# Patient Record
Sex: Male | Born: 1990 | Race: Black or African American | Hispanic: No | Marital: Single | Smoking: Never smoker
Health system: Southern US, Community
[De-identification: ages and names within clinical notes are randomized; demographics above are authoritative.]

## PROBLEM LIST (undated history)

## (undated) DIAGNOSIS — I10 Essential (primary) hypertension: Secondary | ICD-10-CM

## (undated) DIAGNOSIS — R569 Unspecified convulsions: Secondary | ICD-10-CM

---
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
## 2011-02-13 ENCOUNTER — Emergency Department (HOSPITAL_COMMUNITY)
Admission: EM | Admit: 2011-02-13 | Discharge: 2011-02-13 | Disposition: A | Discharge status: Home / Self Care | Payer: BC Managed Care – PPO | Attending: Emergency Medicine | Admitting: Emergency Medicine

## 2011-02-13 DIAGNOSIS — R112 Nausea with vomiting, unspecified: Secondary | ICD-10-CM | POA: Insufficient documentation

## 2011-02-13 DIAGNOSIS — R197 Diarrhea, unspecified: Secondary | ICD-10-CM | POA: Insufficient documentation

## 2011-02-13 DIAGNOSIS — IMO0001 Reserved for inherently not codable concepts without codable children: Secondary | ICD-10-CM | POA: Insufficient documentation

## 2013-04-22 ENCOUNTER — Ambulatory Visit: Payer: BC Managed Care – PPO | Admitting: Internal Medicine

## 2013-04-22 DIAGNOSIS — Z0289 Encounter for other administrative examinations: Secondary | ICD-10-CM

## 2013-11-16 ENCOUNTER — Ambulatory Visit (INDEPENDENT_AMBULATORY_CARE_PROVIDER_SITE_OTHER): Payer: BC Managed Care – PPO | Admitting: Family

## 2013-11-16 ENCOUNTER — Encounter: Payer: Self-pay | Admitting: Family

## 2013-11-16 VITALS — BP 100/80 | HR 59 | Ht 73.5 in | Wt 162.0 lb

## 2013-11-16 DIAGNOSIS — Z0000 Encounter for general adult medical examination without abnormal findings: Secondary | ICD-10-CM

## 2013-11-16 DIAGNOSIS — Z23 Encounter for immunization: Secondary | ICD-10-CM

## 2013-11-16 NOTE — Progress Notes (Signed)
  Subjective:    Patient ID: Tim Foley, male    DOB: 1991/03/20, 22 y.o.   MRN: [id number]  HPI 22 year old [place] American male, nonsmoker, and sent for complete physical exam and to establish care. He denies any concerns today. He is declining any fasting labs for STD screening. Declines flu shot.   Review of Systems  Constitutional: Negative.   HENT: Negative.   Eyes: Negative.   Respiratory: Negative.   Cardiovascular: Negative.   Gastrointestinal: Negative.   Endocrine: Negative.   Genitourinary: Negative.   Musculoskeletal: Negative.   Skin: Negative.   Allergic/Immunologic: Negative.   Neurological: Negative.   Hematological: Negative.   Psychiatric/Behavioral: Negative.    History reviewed. No pertinent past medical history.  History   Social History  . Marital Status: Single    Spouse Name: N/A    Number of Children: N/A  . Years of Education: N/A   Occupational History  . Not on file.   Social History Main Topics  . Smoking status: Never Smoker   . Smokeless tobacco: Not on file  . Alcohol Use: No  . Drug Use: No  . Sexual Activity: Not on file   Other Topics Concern  . Not on file   Social History Narrative  . No narrative on file    History reviewed. No pertinent past surgical history.  No family history on file.  No Known Allergies  No current outpatient prescriptions on file prior to visit.   No current facility-administered medications on file prior to visit.    BP 100/80  Pulse 59  Ht 6' 1.5" (1.867 m)  Wt 162 lb (73.483 kg)  BMI 21.08 kg/m2chart     Objective:   Physical Exam  Constitutional: He is oriented to person, place, and time. He appears well-developed and well-nourished.  HENT:  Right Ear: External ear normal.  Left Ear: External ear normal.  Nose: Nose normal.  Mouth/Throat: Oropharynx is clear and moist.  Eyes: Conjunctivae and EOM are normal. Pupils are equal, round, and reactive to light.  Neck: Normal  range of motion. Neck supple.  Cardiovascular: Normal rate, regular rhythm and normal heart sounds.   Pulmonary/Chest: Effort normal and breath sounds normal.  Abdominal: Soft. Bowel sounds are normal.  Genitourinary: Penis normal. No penile tenderness.  Musculoskeletal: Normal range of motion.  Neurological: He is alert and oriented to person, place, and time. He has normal reflexes.  Skin: Skin is warm and dry.  Psychiatric: He has a normal mood and affect.          Assessment & Plan:  Assessment: Complete physical exam   Plan: Encouraged healthy diet, exercise, monthly testicular exams, exercise, safe sex practices, refrain from tobacco and alcohol use.Recheck in the near and sooner as needed

## 2013-11-16 NOTE — Patient Instructions (Signed)
Exercise to Stay Healthy Exercise helps you become and stay healthy. EXERCISE IDEAS AND TIPS Choose exercises that:  You enjoy.  Fit into your day. You do not need to exercise really hard to be healthy. You can do exercises at a slow or medium level and stay healthy. You can:  Stretch before and after working out.  Try yoga, Pilates, or tai chi.  Lift weights.  Walk fast, swim, jog, run, climb stairs, bicycle, dance, or rollerskate.  Take aerobic classes. Exercises that burn about 150 calories:  Running 1  miles in 15 minutes.  Playing volleyball for 45 to 60 minutes.  Washing and waxing a car for 45 to 60 minutes.  Playing touch football for 45 minutes.  Walking 1  miles in 35 minutes.  Pushing a stroller 1  miles in 30 minutes.  Playing basketball for 30 minutes.  Raking leaves for 30 minutes.  Bicycling 5 miles in 30 minutes.  Walking 2 miles in 30 minutes.  Dancing for 30 minutes.  Shoveling snow for 15 minutes.  Swimming laps for 20 minutes.  Walking up stairs for 15 minutes.  Bicycling 4 miles in 15 minutes.  Gardening for 30 to 45 minutes.  Jumping rope for 15 minutes.  Washing windows or floors for 45 to 60 minutes. Document Released: 01/18/2011 Document Revised: 03/09/2012 Document Reviewed: 01/18/2011 Exit[employer] Patient Information 2014 [employer].  

## 2017-07-23 ENCOUNTER — Ambulatory Visit (INDEPENDENT_AMBULATORY_CARE_PROVIDER_SITE_OTHER): Payer: BLUE CROSS/BLUE SHIELD | Admitting: Adult Health

## 2017-07-23 ENCOUNTER — Encounter: Payer: Self-pay | Admitting: Adult Health

## 2017-07-23 VITALS — BP 110/76 | HR 79 | Temp 98.1°F | Ht 73.5 in | Wt 167.3 lb

## 2017-07-23 DIAGNOSIS — Z0000 Encounter for general adult medical examination without abnormal findings: Secondary | ICD-10-CM

## 2017-07-23 NOTE — Patient Instructions (Signed)
It was great meeting you today   Continue to stay active and exercise. Work on a heart healthy diet.   You can follow up with me in 1-2 years for your next physical unless you need something before that

## 2017-07-23 NOTE — Progress Notes (Signed)
Patient presents to clinic today to establish care. He is a pleasant 26 year old male who  has no past medical history on file.   Acute Concerns: CPE    Chronic Issues: None   Health Maintenance: Dental -- Does not do routine care  Vision -- Does not do routine care Immunizations -- UTD Diet:  Does not follow a specific diet.  Exercise: He plays a lot of basketball   No past medical history on file.  No past surgical history on file.  No current outpatient prescriptions on file prior to visit.   No current facility-administered medications on file prior to visit.     No Known Allergies  Family History  Problem Relation Age of Onset  . High blood pressure Mother   . Colon cancer Maternal Grandmother   . High blood pressure Maternal Grandmother   . Diabetes Maternal Grandmother     Social History   Social History  . Marital status: Single    Spouse name: N/A  . Number of children: N/A  . Years of education: N/A   Occupational History  . Not on file.   Social History Main Topics  . Smoking status: Never Smoker  . Smokeless tobacco: Never Used  . Alcohol use 1.2 oz/week    1 Glasses of wine, 1 Cans of beer per week  . Drug use: No  . Sexual activity: Yes    Partners: Female    Birth control/ protection: None   Other Topics Concern  . Not on file   Social History Narrative   He works at [employer] and gamble       He likes to be outside and play basketball.        Review of Systems  Constitutional: Negative.   HENT: Negative.   Eyes: Negative.   Respiratory: Negative.   Cardiovascular: Negative.   Gastrointestinal: Negative.   Genitourinary: Negative.   Musculoskeletal: Negative.   Skin: Negative.   Neurological: Negative.   Endo/Heme/Allergies: Negative.   Psychiatric/Behavioral: Negative.   All other systems reviewed and are negative.   BP 132/68 (BP Location: Left Arm, Patient Position: Sitting, Cuff Size: Normal)   Pulse 79   Temp  98.1 F (36.7 C) (Oral)   Ht 6' 1.5" (1.867 m)   Wt 167 lb 4.8 oz (75.9 kg)   SpO2 98%   BMI 21.77 kg/m   Physical Exam  Constitutional: He is oriented to person, place, and time and well-developed, well-nourished, and in no distress. No distress.  HENT:  Head: Normocephalic and atraumatic.  Right Ear: External ear normal.  Left Ear: External ear normal.  Nose: Nose normal.  Mouth/Throat: Oropharynx is clear and moist. No oropharyngeal exudate.  Eyes: Pupils are equal, round, and reactive to light. Conjunctivae and EOM are normal. Right eye exhibits no discharge. Left eye exhibits no discharge. No scleral icterus.  Neck: Normal range of motion. Neck supple. No JVD present. No tracheal deviation present. No thyromegaly present.  Cardiovascular: Normal rate, regular rhythm, normal heart sounds and intact distal pulses.  Exam reveals no gallop and no friction rub.   No murmur heard. Pulmonary/Chest: Effort normal and breath sounds normal. No stridor. No respiratory distress. He has no wheezes. He has no rales. He exhibits no tenderness.  Abdominal: Soft. Bowel sounds are normal. He exhibits no distension and no mass. There is no tenderness. There is no rebound and no guarding.  Musculoskeletal: Normal range of motion. He exhibits no edema, tenderness  or deformity.  Lymphadenopathy:    He has no cervical adenopathy.  Neurological: He is alert and oriented to person, place, and time. He displays normal reflexes. No cranial nerve deficit. He exhibits normal muscle tone. Coordination normal. GCS score is 15.  Skin: Skin is warm and dry. No rash noted. He is not diaphoretic. No erythema. No pallor.  Psychiatric: Mood, memory, affect and judgment normal.  Nursing note and vitals reviewed.  Assessment/Plan: 1. Routine general medical examination at a health care facility - Benign exam  - He is a healthy 26 year old AA male  - Will forgo lab work today  - He is not concerned for STI  -  Follow up in 1-2 years or PRN for acute issues   Shirline Freesory Franco Duley, NP

## 2020-07-10 ENCOUNTER — Emergency Department (HOSPITAL_COMMUNITY): Payer: BLUE CROSS/BLUE SHIELD

## 2020-07-10 ENCOUNTER — Other Ambulatory Visit: Payer: Self-pay

## 2020-07-10 ENCOUNTER — Emergency Department (HOSPITAL_COMMUNITY)
Admission: EM | Admit: 2020-07-10 | Discharge: 2020-07-10 | Disposition: A | Discharge status: Home / Self Care | Payer: BLUE CROSS/BLUE SHIELD | Attending: Emergency Medicine | Admitting: Emergency Medicine

## 2020-07-10 ENCOUNTER — Encounter (HOSPITAL_COMMUNITY): Payer: Self-pay | Admitting: Emergency Medicine

## 2020-07-10 DIAGNOSIS — I10 Essential (primary) hypertension: Secondary | ICD-10-CM

## 2020-07-10 DIAGNOSIS — R0602 Shortness of breath: Secondary | ICD-10-CM | POA: Insufficient documentation

## 2020-07-10 DIAGNOSIS — Z20822 Contact with and (suspected) exposure to covid-19: Secondary | ICD-10-CM | POA: Insufficient documentation

## 2020-07-10 LAB — BASIC METABOLIC PANEL
Anion gap: 14 (ref 5–15)
BUN: 10 mg/dL (ref 6–20)
CO2: 26 mmol/L (ref 22–32)
Calcium: 9.4 mg/dL (ref 8.9–10.3)
Chloride: 99 mmol/L (ref 98–111)
Creatinine, Ser: 1.08 mg/dL (ref 0.61–1.24)
GFR calc Af Amer: >60 mL/min (ref >60)
GFR calc non Af Amer: >60 mL/min (ref >60)
Glucose, Bld: 96 mg/dL (ref 70–99)
Potassium: 3.3 mmol/L — ABNORMAL LOW (ref 3.5–5.1)
Sodium: 139 mmol/L (ref 135–145)

## 2020-07-10 LAB — CBC
HCT: 42.8 % (ref 39.0–52.0)
Hemoglobin: 13.6 g/dL (ref 13.0–17.0)
MCH: 23.1 pg — ABNORMAL LOW (ref 26.0–34.0)
MCHC: 31.8 g/dL (ref 30.0–36.0)
MCV: 72.5 fL — ABNORMAL LOW (ref 80.0–100.0)
Platelets: 263 10*3/uL (ref 150–400)
RBC: 5.9 MIL/uL — ABNORMAL HIGH (ref 4.22–5.81)
RDW: 14.3 % (ref 11.5–15.5)
WBC: 7.6 10*3/uL (ref 4.0–10.5)
nRBC: 0 % (ref 0.0–0.2)

## 2020-07-10 LAB — D-DIMER, QUANTITATIVE: D-Dimer, Quant: <0.27 ug/mL-FEU (ref 0.00–0.50)

## 2020-07-10 LAB — CBG MONITORING, ED: Glucose-Capillary: 90 mg/dL (ref 70–99)

## 2020-07-10 LAB — SARS CORONAVIRUS 2 BY RT PCR (HOSPITAL ORDER, PERFORMED IN ~~LOC~~ HOSPITAL LAB): SARS Coronavirus 2: NEGATIVE

## 2020-07-10 LAB — TROPONIN I (HIGH SENSITIVITY): Troponin I (High Sensitivity): 3 ng/L (ref <18)

## 2020-07-10 MED ORDER — CETIRIZINE HCL 10 MG PO TABS: 10.0000 mg | ORAL_TABLET | Freq: Every day | ORAL | 1 refills | Status: DC

## 2020-07-10 MED ORDER — ASPIRIN 81 MG PO CHEW
324.0000 mg | CHEWABLE_TABLET | Freq: Once | ORAL | Status: AC
  Administered 2020-07-10: 324 mg via ORAL
  Filled 2020-07-10: qty 4

## 2020-07-10 MED ORDER — NAPROXEN 500 MG PO TABS: 500.0000 mg | ORAL_TABLET | Freq: Two times a day (BID) | ORAL | 0 refills | Status: DC

## 2020-07-10 MED ORDER — FLUTICASONE PROPIONATE 50 MCG/ACT NA SUSP: 1.0000 | Freq: Every day | NASAL | 2 refills | Status: DC

## 2020-07-10 MED ORDER — ACETAMINOPHEN 325 MG PO TABS
650.0000 mg | ORAL_TABLET | Freq: Once | ORAL | Status: AC
  Administered 2020-07-10: 650 mg via ORAL
  Filled 2020-07-10: qty 2

## 2020-07-10 NOTE — ED Triage Notes (Signed)
Patient presents with SOB for a week, worsening today.

## 2020-07-10 NOTE — Discharge Instructions (Addendum)
Take the medications as prescribed, follow-up with your primary care doctor to recheck your blood pressure and see if the medication helps.  Discuss further evaluation such as an echocardiogram.  Return to the ED as needed for worsening symptoms

## 2020-07-10 NOTE — ED Provider Notes (Signed)
[facility]-EMERGENCY DEPT Provider Note   CSN: [id number] Arrival date & time: 07/10/20  1717     History Chief Complaint  Patient presents with  . Shortness of Breath    Tim Foley is a 29 y.o. male.  HPI   Patient presents the emergency room for evaluation of shortness of breath.  Patient states the symptoms started about a week or so ago.  He feels like they are worsening today.  It is somewhat intermittent and mostly feels like he has difficulty breathing through his nose a lot to breathe through his mouth.  He denies having any chest pain.  He has not had any fevers.  He denies any trouble with his sense of taste or smell.  No known Covid exposure.  Patient does not use any drugs.  He does smoke hookah occasionally.  Patient does not have any history of medical problems.  He has not been vaccinated against COVID-19  History reviewed. No pertinent past medical history.  There are no problems to display for this patient.   History reviewed. No pertinent surgical history.     Family History  Problem Relation Age of Onset  . High blood pressure Mother   . Colon cancer Maternal Grandmother   . High blood pressure Maternal Grandmother   . Diabetes Maternal Grandmother     Social History   Tobacco Use  . Smoking status: Never Smoker  . Smokeless tobacco: Never Used  Substance Use Topics  . Alcohol use: Yes    Alcohol/week: 2.0 standard drinks    Types: 1 Glasses of wine, 1 Cans of beer per week  . Drug use: No    Home Medications Prior to Admission medications   Medication Sig Start Date End Date Taking? Authorizing Provider  cetirizine (ZYRTEC) 10 MG tablet Take 1 tablet (10 mg total) by mouth daily. 07/10/20   Linwood Dibbles, MD  fluticasone (FLONASE) 50 MCG/ACT nasal spray Place 1 spray into both nostrils daily. 07/10/20   Linwood Dibbles, MD    Allergies    Patient has no known allergies.  Review of Systems   Review of Systems  All other  systems reviewed and are negative.   Physical Exam Updated Vital Signs BP (!) 151/76   Pulse 74   Temp 98 F (36.7 C) (Oral)   Resp 18   Ht 1.88 m (6\' 2" )   Wt 90.7 kg   SpO2 100%   BMI 25.68 kg/m   Physical Exam Vitals and nursing note reviewed.  Constitutional:      General: He is not in acute distress.    Appearance: He is well-developed.  HENT:     Head: Normocephalic and atraumatic.     Right Ear: External ear normal.     Left Ear: External ear normal.  Eyes:     General: No scleral icterus.       Right eye: No discharge.        Left eye: No discharge.     Conjunctiva/sclera: Conjunctivae normal.  Neck:     Trachea: No tracheal deviation.  Cardiovascular:     Rate and Rhythm: Normal rate and regular rhythm.  Pulmonary:     Effort: Pulmonary effort is normal. No respiratory distress.     Breath sounds: Normal breath sounds. No stridor. No wheezing or rales.  Abdominal:     General: Bowel sounds are normal. There is no distension.     Palpations: Abdomen is soft.  Tenderness: There is no abdominal tenderness. There is no guarding or rebound.  Musculoskeletal:        General: No tenderness.     Cervical back: Neck supple.  Skin:    General: Skin is warm and dry.     Findings: No rash.  Neurological:     Mental Status: He is alert.     Cranial Nerves: No cranial nerve deficit (no facial droop, extraocular movements intact, no slurred speech).     Sensory: No sensory deficit.     Motor: No abnormal muscle tone or seizure activity.     Coordination: Coordination normal.     ED Results / Procedures / Treatments   Labs (all labs ordered are listed, but only abnormal results are displayed) Labs Reviewed  BASIC METABOLIC PANEL - Abnormal; Notable for the following components:      Result Value   Potassium 3.3 (*)    All other components within normal limits  CBC - Abnormal; Notable for the following components:   RBC 5.90 (*)    MCV 72.5 (*)    MCH  23.1 (*)    All other components within normal limits  SARS CORONAVIRUS 2 BY RT PCR (HOSPITAL ORDER, PERFORMED IN [facility])  D-DIMER, QUANTITATIVE (NOT AT [facility])  CBG MONITORING, ED  TROPONIN I (HIGH SENSITIVITY)    EKG EKG Interpretation  Date/Time:  Monday July 10 2020 17:32:27 EDT Ventricular Rate:  81 PR Interval:    QRS Duration: 82 QT Interval:  359 QTC Calculation: 417 R Axis:   73 Text Interpretation: Ectopic atrial rhythm Consider left atrial enlargement nonspecific st changes inferiorly Borderline ST elevation, anterior leads No previous tracing Confirmed by Linwood Dibbles [phone]) on 07/10/2020 7:13:50 PM   Radiology DG Chest Portable 1 View  Result Date: 07/10/2020 CLINICAL DATA:  Dyspnea EXAM: PORTABLE CHEST 1 VIEW COMPARISON:  None. FINDINGS: The heart size and mediastinal contours are within normal limits. Both lungs are clear. The visualized skeletal structures are unremarkable. IMPRESSION: No active disease. Electronically Signed   By: Jonna Clark M.D.   On: 07/10/2020 18:25    Procedures Procedures (including critical care time)  Medications Ordered in ED Medications  aspirin chewable tablet 324 mg (324 mg Oral Given 07/10/20 1800)    ED Course  I have reviewed the triage vital signs and the nursing notes.  Pertinent labs & imaging results that were available during my care of the patient were reviewed by me and considered in my medical decision making (see chart for details).  Clinical Course as of Jul 11 1999  Mon Jul 10, 2020  1746 EKG abnormal.  Doubt ST elevation MI   [JK]  1925 Labs reviewed.  Covid test is negative.  Heart enzyme is normal.  D-dimer is negative.   [JK]  1925 Chest x-ray does not show pneumonia.   [JK]    Clinical Course User Index [JK] Linwood Dibbles, MD   MDM Rules/Calculators/A&P                          Patient is into the ED for evaluation of shortness of breath.  Patient states he primarily has difficulty  breathing through his nose and will at times have to breathe through his mouth.  In the ED he is breathing comfortably.  He is not wheezing.  His oxygen saturation is normal.  ED work-up is reassuring.  D-dimer is negative.  Troponin is normal.  Chest x-ray is normal.  Covid test is negative.  Doubt ACS, PE, pneumoniA.  Question whether her symptoms may be related to allergies.  Patient does spend a lot of time outside.  Will discharge home with course of Flonase and allergy medications.  Discussed his hypertension and abnormal EKG.  Recommend outpatient follow-up with a primary care doctor to recheck his blood pressure.  This may be the source of his abnormal EKG.  At this time there does not appear to be any evidence of an acute emergency medical condition and the patient appears stable for discharge with appropriate outpatient follow up.  Final Clinical Impression(s) / ED Diagnoses Final diagnoses:  SOB (shortness of breath)  Hypertension, unspecified type    Rx / DC Orders ED Discharge Orders         Ordered    fluticasone (FLONASE) 50 MCG/ACT nasal spray  Daily     Discontinue  Reprint     07/10/20 1959    cetirizine (ZYRTEC) 10 MG tablet  Daily     Discontinue  Reprint     07/10/20 1959           Linwood Dibbles, MD 07/10/20 2001

## 2021-05-15 IMAGING — DX DG CHEST 1V PORT
1 series · 1 of 1 positions shown · non-contrast
Comparison: None.

CLINICAL DATA: Dyspnea

EXAM:
PORTABLE CHEST 1 VIEW

[chest ap]
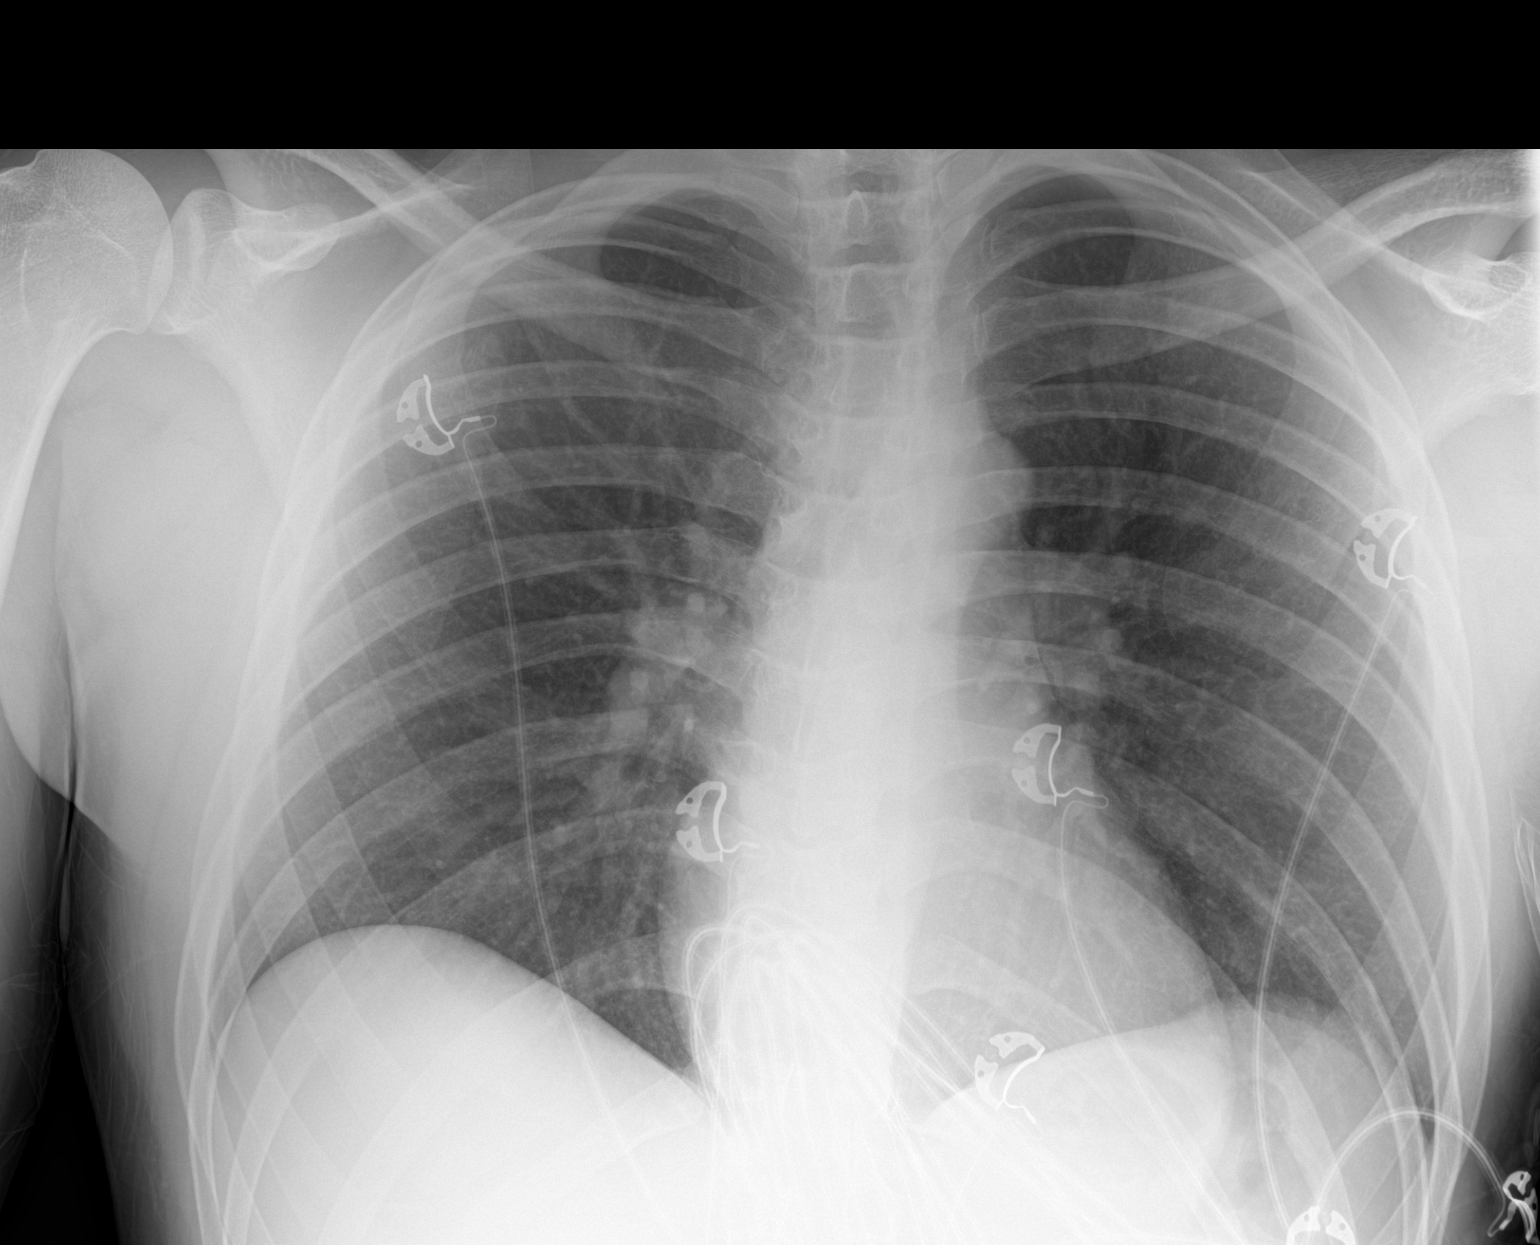

[1 of 1 positions shown; findings below may reference images not displayed]

FINDINGS: The heart size and mediastinal contours are within normal limits.
Both lungs are clear. The visualized skeletal structures are
unremarkable.
IMPRESSION: No active disease.

## 2023-10-21 ENCOUNTER — Encounter: Payer: Self-pay | Admitting: Adult Health

## 2023-10-21 ENCOUNTER — Ambulatory Visit (INDEPENDENT_AMBULATORY_CARE_PROVIDER_SITE_OTHER): Payer: Self-pay | Admitting: Adult Health

## 2023-10-21 VITALS — BP 140/88 | HR 99 | Temp 98.2°F | Ht 72.75 in | Wt 188.0 lb

## 2023-10-21 DIAGNOSIS — I10 Essential (primary) hypertension: Secondary | ICD-10-CM

## 2023-10-21 DIAGNOSIS — Z0000 Encounter for general adult medical examination without abnormal findings: Secondary | ICD-10-CM

## 2023-10-21 NOTE — Patient Instructions (Signed)
It was great seeing you today   We will follow up with you regarding your lab work   Please let me know if you need anything   

## 2023-10-21 NOTE — Progress Notes (Signed)
Patient presents to clinic today to reestablish care. He was last see in July 2018.   Acute Concerns: Establish Care     Chronic Issues: Elevated blood pressure - he does not check his BP at home. He denies symptoms of elevated BP.  He had elevation in is BP at an ER visit in 2021 and it was mildly elevated in the office today.   Health Maintenance: Dental - Does not go to dentist Vision -- Does not go to eye doctor.  Immunizations -- Refuses Tdap ad influenza  Colonoscopy -- Never had  Diet: Tries to eat at home. Does not eat fast food.  Exercise - stays active at work. Does not exercise outside of that     History reviewed. No pertinent past medical history.  History reviewed. No pertinent surgical history.  No current outpatient medications on file prior to visit.   No current facility-administered medications on file prior to visit.    No Known Allergies  Family History  Problem Relation Age of Onset   High blood pressure Mother    Heart attack Father    Colon cancer Maternal Grandmother    High blood pressure Maternal Grandmother    Diabetes Maternal Grandmother    Cancer - Colon Maternal Grandmother     Social History   Socioeconomic History   Marital status: Single    Spouse name: Not on file   Number of children: Not on file   Years of education: Not on file   Highest education level: Not on file  Occupational History   Not on file  Tobacco Use   Smoking status: Never   Smokeless tobacco: Never  Vaping Use   Vaping status: Never Used  Substance and Sexual Activity   Alcohol use: Yes    Alcohol/week: 4.0 standard drinks of alcohol    Types: 1 Glasses of wine, 1 Cans of beer, 2 Shots of liquor per week    Comment: occasionlly   Drug use: No   Sexual activity: Yes    Partners: Female    Birth control/protection: None  Other Topics Concern   Not on file  Social History Narrative   He works at [employer] and gamble    Social Determinants of  Corporate investment banker Strain: Not on file  Food Insecurity: Not on file  Transportation Needs: Not on file  Physical Activity: Not on file  Stress: Not on file  Social Connections: Not on file  Intimate Partner Violence: Not on file    Review of Systems  Constitutional: Negative.   HENT: Negative.    Eyes: Negative.   Respiratory: Negative.    Cardiovascular: Negative.   Gastrointestinal: Negative.   Genitourinary: Negative.   Musculoskeletal: Negative.   Skin: Negative.   Neurological: Negative.   Endo/Heme/Allergies: Negative.   Psychiatric/Behavioral: Negative.      BP (!) 140/88   Pulse 99   Temp 98.2 F (36.8 C) (Oral)   Ht 6' 0.75" (1.848 m)   Wt 188 lb (85.3 kg)   SpO2 97%   BMI 24.97 kg/m   Physical Exam Vitals and nursing note reviewed.  Constitutional:      General: He is not in acute distress.    Appearance: Normal appearance. He is not ill-appearing.  HENT:     Head: Normocephalic and atraumatic.     Right Ear: Tympanic membrane, ear canal and external ear normal. There is no impacted cerumen.     Left Ear: Tympanic  membrane, ear canal and external ear normal. There is no impacted cerumen.     Nose: Nose normal. No congestion or rhinorrhea.     Mouth/Throat:     Mouth: Mucous membranes are moist.     Pharynx: Oropharynx is clear.  Eyes:     Extraocular Movements: Extraocular movements intact.     Conjunctiva/sclera: Conjunctivae normal.     Pupils: Pupils are equal, round, and reactive to light.  Neck:     Vascular: No carotid bruit.  Cardiovascular:     Rate and Rhythm: Normal rate and regular rhythm.     Pulses: Normal pulses.     Heart sounds: No murmur heard.    No friction rub. No gallop.  Pulmonary:     Effort: Pulmonary effort is normal.     Breath sounds: Normal breath sounds.  Abdominal:     General: Abdomen is flat. Bowel sounds are normal. There is no distension.     Palpations: Abdomen is soft. There is no mass.      Tenderness: There is no abdominal tenderness. There is no guarding or rebound.     Hernia: No hernia is present.  Musculoskeletal:        General: Normal range of motion.     Cervical back: Normal range of motion and neck supple.  Lymphadenopathy:     Cervical: No cervical adenopathy.  Skin:    General: Skin is warm and dry.     Capillary Refill: Capillary refill takes less than 2 seconds.  Neurological:     General: No focal deficit present.     Mental Status: He is alert and oriented to person, place, and time.  Psychiatric:        Mood and Affect: Mood normal.        Behavior: Behavior normal.        Thought Content: Thought content normal.        Judgment: Judgment normal.     Assessment/Plan: 1. Routine general medical examination at a health care facility Today patient counseled on age appropriate routine health concerns for screening and prevention, each reviewed and up to date or declined. Immunizations reviewed and up to date or declined. Labs ordered and reviewed. Risk factors for depression reviewed and negative. Hearing function and visual acuity are intact. ADLs screened and addressed as needed. Functional ability and level of safety reviewed and appropriate. Education, counseling and referrals performed based on assessed risks today. Patient provided with a copy of personalized plan for preventive services. - Follow up in one year  - Continue to eat healthy and encouraged exercise   2. Primary hypertension - Will have him check his BP at home over the next few days and he will let me know what they are when he call him about his labs - Lipid panel; Future - TSH; Future - CBC; Future - Comprehensive metabolic panel; Future - Hemoglobin A1c; Future - Hemoglobin A1c - Comprehensive metabolic panel - CBC - TSH - Lipid panel   Shirline Frees, NP

## 2023-10-22 LAB — COMPREHENSIVE METABOLIC PANEL
ALT: 109 U/L — ABNORMAL HIGH (ref 0–53)
AST: 160 U/L — ABNORMAL HIGH (ref 0–37)
Albumin: 4.7 g/dL (ref 3.5–5.2)
Alkaline Phosphatase: 49 U/L (ref 39–117)
BUN: 12 mg/dL (ref 6–23)
CO2: 26 meq/L (ref 19–32)
Calcium: 9.5 mg/dL (ref 8.4–10.5)
Chloride: 102 meq/L (ref 96–112)
Creatinine, Ser: 0.96 mg/dL (ref 0.40–1.50)
GFR: 105.29 mL/min (ref >60.00)
Glucose, Bld: 77 mg/dL (ref 70–99)
Potassium: 4 meq/L (ref 3.5–5.1)
Sodium: 140 meq/L (ref 135–145)
Total Bilirubin: 0.8 mg/dL (ref 0.2–1.2)
Total Protein: 7.6 g/dL (ref 6.0–8.3)

## 2023-10-22 LAB — CBC
HCT: 38.2 % — ABNORMAL LOW (ref 39.0–52.0)
Hemoglobin: 11.7 g/dL — ABNORMAL LOW (ref 13.0–17.0)
MCHC: 30.5 g/dL (ref 30.0–36.0)
MCV: 75.5 fL — ABNORMAL LOW (ref 78.0–100.0)
Platelets: 223 10*3/uL (ref 150.0–400.0)
RBC: 5.07 Mil/uL (ref 4.22–5.81)
RDW: 15.4 % (ref 11.5–15.5)
WBC: 6 10*3/uL (ref 4.0–10.5)

## 2023-10-22 LAB — TSH: TSH: 0.86 u[IU]/mL (ref 0.35–5.50)

## 2023-10-22 LAB — HEMOGLOBIN A1C: Hgb A1c MFr Bld: 4.9 % (ref 4.6–6.5)

## 2023-10-22 LAB — LIPID PANEL
Cholesterol: 350 mg/dL — ABNORMAL HIGH (ref 0–200)
HDL: 92.9 mg/dL (ref >39.00)
LDL Cholesterol: 229 mg/dL — ABNORMAL HIGH (ref 0–99)
NonHDL: 257.21
Total CHOL/HDL Ratio: 4
Triglycerides: 141 mg/dL (ref 0.0–149.0)
VLDL: 28.2 mg/dL (ref 0.0–40.0)

## 2023-10-23 ENCOUNTER — Other Ambulatory Visit: Payer: Self-pay | Admitting: Adult Health

## 2023-10-23 DIAGNOSIS — E782 Mixed hyperlipidemia: Secondary | ICD-10-CM

## 2023-10-23 DIAGNOSIS — R748 Abnormal levels of other serum enzymes: Secondary | ICD-10-CM

## 2023-10-23 MED ORDER — ATORVASTATIN CALCIUM 20 MG PO TABS: 20.0000 mg | ORAL_TABLET | Freq: Every day | ORAL | 0 refills | Status: DC

## 2024-02-12 ENCOUNTER — Other Ambulatory Visit: Payer: Self-pay

## 2024-02-12 ENCOUNTER — Encounter (HOSPITAL_COMMUNITY): Payer: Self-pay | Admitting: Emergency Medicine

## 2024-02-12 ENCOUNTER — Emergency Department (HOSPITAL_COMMUNITY)
Admission: EM | Admit: 2024-02-12 | Discharge: 2024-02-13 | Disposition: A | Discharge status: Home / Self Care | Payer: PRIVATE HEALTH INSURANCE | Attending: Emergency Medicine | Admitting: Emergency Medicine

## 2024-02-12 DIAGNOSIS — T7840XA Allergy, unspecified, initial encounter: Secondary | ICD-10-CM | POA: Insufficient documentation

## 2024-02-12 MED ORDER — DEXAMETHASONE SODIUM PHOSPHATE 10 MG/ML IJ SOLN
10.0000 mg | Freq: Once | INTRAMUSCULAR | Status: AC
  Administered 2024-02-12: 10 mg via INTRAMUSCULAR
  Filled 2024-02-12: qty 1

## 2024-02-12 MED ORDER — DIPHENHYDRAMINE HCL 25 MG PO CAPS
50.0000 mg | ORAL_CAPSULE | Freq: Once | ORAL | Status: AC
  Administered 2024-02-12: 50 mg via ORAL
  Filled 2024-02-12: qty 2

## 2024-02-12 NOTE — ED Notes (Signed)
Patient brought back to triage for reassessment and vitals. Hives have improved and patient states he is feeling better. Denies difficulty breathing or GI upset. Update on patient status and plan of care given to patient.

## 2024-02-12 NOTE — ED Triage Notes (Signed)
Patient reports generalized hives that started this afternoon. No known allergies. No new foods or medications. Denies shortness of breath, feeling of throat closure, or nausea/vomiting.

## 2024-02-12 NOTE — ED Provider Triage Note (Signed)
Emergency Medicine Provider Triage Evaluation Note  Tim Foley , a 33 y.o. male  was evaluated in triage.  Pt complains of laying down on the couch earlier today, and then started feeling itchy.  He then noticed a generalized rash on his face, torso, and arms bilaterally.  Denies any difficulty breathing, fluids include throat closure, lip or tongue swelling.  Denies any nausea or vomiting.  Denies any new medications or foods.  Denies any known allergies.  Review of Systems  Positive: As above Negative: As above  Physical Exam  BP (!) 164/96 (BP Location: Right Arm)   Pulse 100   Temp 97.8 F (36.6 C) (Oral)   Resp 17   SpO2 100%  Gen:   Awake, no distress   Resp:  Normal effort  MSK:   Moves extremities without difficulty   Breathing comfortably on room air, no stridor.  No lip or tongue swelling.  No edema of the posterior oropharynx. Raised macules on the chest and back, arms, and face.  Medical Decision Making  Medically screening exam initiated at 8:56 PM.  Appropriate orders placed.  Tim Foley was informed that the remainder of the evaluation will be completed by another provider, this initial triage assessment does not replace that evaluation, and the importance of remaining in the ED until their evaluation is complete.    No hypotension, nausea or vomiting, feelings of throat closure or difficulty breathing, no concern for anaphylaxis at this time.  Will start with IM Decadron and Benadryl given here in triage.   Tim Merles, PA-C 02/12/24 2058

## 2024-02-13 MED ORDER — PREDNISONE 20 MG PO TABS: 40.0000 mg | ORAL_TABLET | Freq: Every day | ORAL | 0 refills | Status: AC

## 2024-02-13 MED ORDER — DIPHENHYDRAMINE HCL 25 MG PO TABS: 25.0000 mg | ORAL_TABLET | Freq: Four times a day (QID) | ORAL | 0 refills | Status: DC

## 2024-02-13 MED ORDER — EPINEPHRINE 0.3 MG/0.3ML IJ SOAJ: 0.3000 mg | INTRAMUSCULAR | 1 refills | Status: DC | PRN

## 2024-02-13 NOTE — Discharge Instructions (Addendum)
You were evaluated in the Emergency Department and after careful evaluation, we did not find any emergent condition requiring admission or further testing in the hospital.  Your exam/testing today was overall reassuring.  Symptoms likely due to an allergic reaction.  Recommend taking the prednisone steroid medicine daily as prescribed.  Use the Benadryl as needed for itching.  Reserve the EpiPen for severe symptoms as we discussed.  Recommend follow-up with the allergy specialist.  Please return to the Emergency Department if you experience any worsening of your condition.  Thank you for allowing [place] to be a part of your care.

## 2024-02-13 NOTE — ED Provider Notes (Signed)
[facility]EM[facility]ENCY DEPT [facility] Emergency Department Provider Note MRN:  [id number]  Arrival date & time: 02/13/24     Chief Complaint   Allergic Reaction   History of Present Illness   Tim Foley is a 33 y.o. year-old male with no pertinent past medical history presenting to the ED with chief complaint of logic reaction.  Diffuse hives this evening about 30 minutes after eating pizza from [facility], hyper, he was laying in bed.  Hives to the torso, arms, legs, face.  Significantly improved currently after steroids and Benadryl.  Denies ever having any throat closing sensation, no shortness of breath, no vomiting or diarrhea.  Review of Systems  A thorough review of systems was obtained and all systems are negative except as noted in the HPI and PMH.   Patient's Health History   History reviewed. No pertinent past medical history.  History reviewed. No pertinent surgical history.  Family History  Problem Relation Age of Onset   High blood pressure Mother    Heart attack Father    Colon cancer Maternal Grandmother    High blood pressure Maternal Grandmother    Diabetes Maternal Grandmother    Cancer - Colon Maternal Grandmother     Social History   Socioeconomic History   Marital status: Single    Spouse name: Not on file   Number of children: Not on file   Years of education: Not on file   Highest education level: Not on file  Occupational History   Not on file  Tobacco Use   Smoking status: Never   Smokeless tobacco: Never  Vaping Use   Vaping status: Never Used  Substance and Sexual Activity   Alcohol use: Yes    Alcohol/week: 4.0 standard drinks of alcohol    Types: 1 Glasses of wine, 1 Cans of beer, 2 Shots of liquor per week    Comment: occasionlly   Drug use: No   Sexual activity: Yes    Partners: Female    Birth control/protection: None  Other Topics Concern   Not on file  Social History Narrative   He works at [employer] and gamble    Social  Drivers of Corporate investment banker Strain: Not on file  Food Insecurity: Not on file  Transportation Needs: Not on file  Physical Activity: Not on file  Stress: Not on file  Social Connections: Not on file  Intimate Partner Violence: Not on file     Physical Exam   Vitals:   02/12/24 2333 02/13/24 0118  BP: (!) 151/95 (!) 152/94  Pulse: 83 83  Resp: 16 17  Temp: 98.2 F (36.8 C) 98.4 F (36.9 C)  SpO2: 100% 99%    CONSTITUTIONAL: Well-appearing, NAD NEURO/PSYCH:  Alert and oriented x 3, no focal deficits EYES:  eyes equal and reactive ENT/NECK:  no LAD, no JVD CARDIO: Regular rate, well-perfused, normal S1 and S2 PULM:  CTAB no wheezing or rhonchi GI/GU:  non-distended, non-tender MSK/SPINE:  No gross deformities, no edema SKIN:  no rash, atraumatic   *Additional and/or pertinent findings included in MDM below  Diagnostic and Interventional Summary    EKG Interpretation Date/Time:    Ventricular Rate:    PR Interval:    QRS Duration:    QT Interval:    QTC Calculation:   R Axis:      Text Interpretation:         Labs Reviewed - No data to display  No orders to display  Medications  diphenhydrAMINE (BENADRYL) capsule 50 mg (50 mg Oral Given 02/12/24 2112)  dexamethasone (DECADRON) injection 10 mg (10 mg Intramuscular Given 02/12/24 2112)     Procedures  /  Critical Care Procedures  ED Course and Medical Decision Making  Initial Impression and Ddx Denies any new exposures, currently without obvious symptoms at this time.  Seems to have had good improvement with the medications.  Mother showed me some pictures, pretty diffuse hives.  Past medical/surgical history that increases complexity of ED encounter: None  Interpretation of Diagnostics Laboratory and/or imaging options to aid in the diagnosis/care of the patient were considered.  After careful history and physical examination, it was determined that there was no indication for diagnostics at  this time.  Patient Reassessment and Ultimate Disposition/Management     Appropriate for discharge with follow-up.  Patient management required discussion with the following services or consulting groups:  None  Complexity of Problems Addressed Acute illness or injury that poses threat of life of bodily function  Additional Data Reviewed and Analyzed Further history obtained from: Further history from spouse/family member  Additional Factors Impacting ED Encounter Risk Prescriptions  Elmer Sow. Pilar Plate, MD [facility] Emergency Medicine [facility] [email or web address]  Final Clinical Impressions(s) / ED Diagnoses     ICD-10-CM   1. Allergic reaction, initial encounter  T78.40XA       ED Discharge Orders          Ordered    predniSONE (DELTASONE) 20 MG tablet  Daily        02/13/24 0121    EPINEPHrine 0.3 mg/0.3 mL IJ SOAJ injection  As needed        02/13/24 0121    diphenhydrAMINE (BENADRYL) 25 MG tablet  Every 6 hours        02/13/24 0121             Discharge Instructions Discussed with and Provided to Patient:    Discharge Instructions      You were evaluated in the Emergency Department and after careful evaluation, we did not find any emergent condition requiring admission or further testing in the hospital.  Your exam/testing today was overall reassuring.  Symptoms likely due to an allergic reaction.  Recommend taking the prednisone steroid medicine daily as prescribed.  Use the Benadryl as needed for itching.  Reserve the EpiPen for severe symptoms as we discussed.  Recommend follow-up with the allergy specialist.  Please return to the Emergency Department if you experience any worsening of your condition.  Thank you for allowing [place] to be a part of your care.       Sabas Sous, MD 02/13/24 [phone]

## 2024-03-22 ENCOUNTER — Encounter (HOSPITAL_COMMUNITY): Payer: Self-pay

## 2024-03-22 ENCOUNTER — Inpatient Hospital Stay (HOSPITAL_COMMUNITY)
Admission: EM | Admit: 2024-03-22 | Discharge: 2024-03-24 | DRG: 897 | Disposition: A | Discharge status: Home / Self Care | Payer: Self-pay | Attending: Family Medicine | Admitting: Family Medicine

## 2024-03-22 ENCOUNTER — Other Ambulatory Visit: Payer: Self-pay

## 2024-03-22 ENCOUNTER — Emergency Department (HOSPITAL_COMMUNITY): Payer: Self-pay

## 2024-03-22 DIAGNOSIS — F10139 Alcohol abuse with withdrawal, unspecified: Principal | ICD-10-CM | POA: Diagnosis present

## 2024-03-22 DIAGNOSIS — Z79899 Other long term (current) drug therapy: Secondary | ICD-10-CM

## 2024-03-22 DIAGNOSIS — Z833 Family history of diabetes mellitus: Secondary | ICD-10-CM

## 2024-03-22 DIAGNOSIS — K7010 Alcoholic hepatitis without ascites: Secondary | ICD-10-CM | POA: Diagnosis present

## 2024-03-22 DIAGNOSIS — Z800 Family history of malignant neoplasm of digestive organs: Secondary | ICD-10-CM

## 2024-03-22 DIAGNOSIS — D509 Iron deficiency anemia, unspecified: Secondary | ICD-10-CM | POA: Diagnosis present

## 2024-03-22 DIAGNOSIS — F10939 Unspecified convulsions: Secondary | ICD-10-CM

## 2024-03-22 DIAGNOSIS — E7800 Pure hypercholesterolemia, unspecified: Secondary | ICD-10-CM | POA: Diagnosis present

## 2024-03-22 DIAGNOSIS — R739 Hyperglycemia, unspecified: Secondary | ICD-10-CM | POA: Diagnosis present

## 2024-03-22 DIAGNOSIS — F10930 Alcohol use, unspecified with withdrawal, uncomplicated: Secondary | ICD-10-CM

## 2024-03-22 DIAGNOSIS — Z8249 Family history of ischemic heart disease and other diseases of the circulatory system: Secondary | ICD-10-CM

## 2024-03-22 DIAGNOSIS — G4089 Other seizures: Secondary | ICD-10-CM | POA: Diagnosis present

## 2024-03-22 DIAGNOSIS — I10 Essential (primary) hypertension: Secondary | ICD-10-CM | POA: Diagnosis present

## 2024-03-22 DIAGNOSIS — R569 Unspecified convulsions: Principal | ICD-10-CM

## 2024-03-22 HISTORY — DX: Essential (primary) hypertension: I10

## 2024-03-22 LAB — URINALYSIS, ROUTINE W REFLEX MICROSCOPIC
Bilirubin Urine: NEGATIVE
Glucose, UA: NEGATIVE mg/dL
Ketones, ur: 5 mg/dL — AB
Leukocytes,Ua: NEGATIVE
Nitrite: NEGATIVE
Protein, ur: 100 mg/dL — AB
Specific Gravity, Urine: 1.02 (ref 1.005–1.030)
pH: 6 (ref 5.0–8.0)

## 2024-03-22 LAB — COMPREHENSIVE METABOLIC PANEL
ALT: 129 U/L — ABNORMAL HIGH (ref 0–44)
AST: 271 U/L — ABNORMAL HIGH (ref 15–41)
Albumin: 4.7 g/dL (ref 3.5–5.0)
Alkaline Phosphatase: 44 U/L (ref 38–126)
Anion gap: 17 — ABNORMAL HIGH (ref 5–15)
BUN: 9 mg/dL (ref 6–20)
CO2: 22 mmol/L (ref 22–32)
Calcium: 9.5 mg/dL (ref 8.9–10.3)
Chloride: 98 mmol/L (ref 98–111)
Creatinine, Ser: 0.94 mg/dL (ref 0.61–1.24)
GFR, Estimated: >60 mL/min (ref >60)
Glucose, Bld: 125 mg/dL — ABNORMAL HIGH (ref 70–99)
Potassium: 3.7 mmol/L (ref 3.5–5.1)
Sodium: 137 mmol/L (ref 135–145)
Total Bilirubin: 1.9 mg/dL — ABNORMAL HIGH (ref 0.0–1.2)
Total Protein: 8 g/dL (ref 6.5–8.1)

## 2024-03-22 LAB — CBC WITH DIFFERENTIAL/PLATELET
Abs Immature Granulocytes: 0.03 10*3/uL (ref 0.00–0.07)
Basophils Absolute: 0 10*3/uL (ref 0.0–0.1)
Basophils Relative: 0 %
Eosinophils Absolute: 0 10*3/uL (ref 0.0–0.5)
Eosinophils Relative: 0 %
HCT: 37.6 % — ABNORMAL LOW (ref 39.0–52.0)
Hemoglobin: 12 g/dL — ABNORMAL LOW (ref 13.0–17.0)
Immature Granulocytes: 0 %
Lymphocytes Relative: 10 %
Lymphs Abs: 0.8 10*3/uL (ref 0.7–4.0)
MCH: 24.2 pg — ABNORMAL LOW (ref 26.0–34.0)
MCHC: 31.9 g/dL (ref 30.0–36.0)
MCV: 75.8 fL — ABNORMAL LOW (ref 80.0–100.0)
Monocytes Absolute: 0.7 10*3/uL (ref 0.1–1.0)
Monocytes Relative: 9 %
Neutro Abs: 6.4 10*3/uL (ref 1.7–7.7)
Neutrophils Relative %: 81 %
Platelets: 226 10*3/uL (ref 150–400)
RBC: 4.96 MIL/uL (ref 4.22–5.81)
RDW: 15.2 % (ref 11.5–15.5)
WBC: 7.9 10*3/uL (ref 4.0–10.5)
nRBC: 0 % (ref 0.0–0.2)

## 2024-03-22 LAB — ETHANOL: Alcohol, Ethyl (B): <10 mg/dL (ref <10)

## 2024-03-22 LAB — RAPID URINE DRUG SCREEN, HOSP PERFORMED
Amphetamines: NOT DETECTED
Barbiturates: NOT DETECTED
Benzodiazepines: NOT DETECTED
Cocaine: NOT DETECTED
Opiates: NOT DETECTED
Tetrahydrocannabinol: NOT DETECTED

## 2024-03-22 LAB — CBG MONITORING, ED: Glucose-Capillary: 126 mg/dL — ABNORMAL HIGH (ref 70–99)

## 2024-03-22 LAB — MAGNESIUM: Magnesium: 1.8 mg/dL (ref 1.7–2.4)

## 2024-03-22 MED ORDER — LORAZEPAM 2 MG/ML IJ SOLN: 2.0000 mg | Freq: Once | INTRAMUSCULAR | Status: AC

## 2024-03-22 MED ORDER — PROCHLORPERAZINE EDISYLATE 10 MG/2ML IJ SOLN: 5.0000 mg | Freq: Four times a day (QID) | INTRAMUSCULAR | Status: DC | PRN

## 2024-03-22 MED ORDER — LORAZEPAM 1 MG PO TABS: 1.0000 mg | ORAL_TABLET | ORAL | Status: DC | PRN

## 2024-03-22 MED ORDER — ATORVASTATIN CALCIUM 10 MG PO TABS
20.0000 mg | ORAL_TABLET | Freq: Every day | ORAL | Status: DC
  Administered 2024-03-22 – 2024-03-23 (×2): 20 mg via ORAL
  Filled 2024-03-22 (×2): qty 2

## 2024-03-22 MED ORDER — LORAZEPAM 2 MG/ML IJ SOLN
1.0000 mg | INTRAMUSCULAR | Status: DC | PRN
  Filled 2024-03-22: qty 1

## 2024-03-22 MED ORDER — FOLIC ACID 1 MG PO TABS
1.0000 mg | ORAL_TABLET | Freq: Every day | ORAL | Status: DC
  Administered 2024-03-23 – 2024-03-24 (×2): 1 mg via ORAL
  Filled 2024-03-22 (×2): qty 1

## 2024-03-22 MED ORDER — THIAMINE HCL 100 MG/ML IJ SOLN
100.0000 mg | Freq: Every day | INTRAMUSCULAR | Status: DC
  Administered 2024-03-22: 100 mg via INTRAVENOUS
  Filled 2024-03-22: qty 2

## 2024-03-22 MED ORDER — MELATONIN 5 MG PO TABS: 5.0000 mg | ORAL_TABLET | Freq: Every evening | ORAL | Status: DC | PRN

## 2024-03-22 MED ORDER — LORAZEPAM 2 MG/ML IJ SOLN
INTRAMUSCULAR | Status: AC
  Administered 2024-03-22: 2 mg via INTRAVENOUS
  Filled 2024-03-22: qty 1

## 2024-03-22 MED ORDER — POLYETHYLENE GLYCOL 3350 17 G PO PACK: 17.0000 g | PACK | Freq: Every day | ORAL | Status: DC | PRN

## 2024-03-22 MED ORDER — LACTATED RINGERS IV SOLN: INTRAVENOUS | Status: AC

## 2024-03-22 MED ORDER — THIAMINE MONONITRATE 100 MG PO TABS
100.0000 mg | ORAL_TABLET | Freq: Every day | ORAL | Status: DC
  Administered 2024-03-23 – 2024-03-24 (×2): 100 mg via ORAL
  Filled 2024-03-22 (×2): qty 1

## 2024-03-22 MED ORDER — ENOXAPARIN SODIUM 40 MG/0.4ML IJ SOSY
40.0000 mg | PREFILLED_SYRINGE | INTRAMUSCULAR | Status: DC
  Filled 2024-03-22 (×2): qty 0.4

## 2024-03-22 MED ORDER — SODIUM CHLORIDE 0.9 % IV SOLN
2000.0000 mg | Freq: Once | INTRAVENOUS | Status: AC
  Administered 2024-03-22: 2000 mg via INTRAVENOUS
  Filled 2024-03-22: qty 20

## 2024-03-22 MED ORDER — ADULT MULTIVITAMIN W/MINERALS CH
1.0000 | ORAL_TABLET | Freq: Every day | ORAL | Status: DC
  Administered 2024-03-23 – 2024-03-24 (×2): 1 via ORAL
  Filled 2024-03-22 (×2): qty 1

## 2024-03-22 MED ORDER — LACTATED RINGERS IV BOLUS
1000.0000 mL | Freq: Once | INTRAVENOUS | Status: AC
  Administered 2024-03-22: 1000 mL via INTRAVENOUS

## 2024-03-22 NOTE — ED Notes (Signed)
 Witnessed seizure event  Family started to yell for help in the hallway where primary RN, MD Kommor, NT, and 2 other RN immediately were at bedside. NRB and suction placed on pt during seziure. Primary RN grabbed 2mg  of ativan and given @ 1740. Pt seizure lasted 90 seconds. MD asked family member @ bedside if pt drinks and she stated "6-7 shots per day." Seizure pads are in place. Pt is being closely monitored

## 2024-03-22 NOTE — ED Provider Triage Note (Signed)
 Emergency Medicine Provider Triage Evaluation Note  Tim Foley , a 33 y.o. male  was evaluated in triage.  Pt complains of passing out at work. Denies hitting head. Reports he did not get a break today so has been shaking more. Denies chest pain, or shortness of breath. .  Review of Systems  Positive: syncope Negative: Chest pain  Physical Exam  BP (!) 177/110 (BP Location: Right Arm)   Pulse 100   Temp 98.4 F (36.9 C) (Oral)   Resp 18   Ht 6\' 2"  (1.88 m)   Wt 86.2 kg   SpO2 100%   BMI 24.39 kg/m  Gen:   Awake, no distress   Resp:  Normal effort MSK:   Moves extremities without difficulty  Other: +shaking both upper extremities, irregular   Medical Decision Making  Medically screening exam initiated at 3:30 PM.  Appropriate orders placed.  [name] was informed that the remainder of the evaluation will be completed by another provider, this initial triage assessment does not replace that evaluation, and the importance of remaining in the ED until their evaluation is complete.     Pete Pelt, [place] 03/22/24 1531

## 2024-03-22 NOTE — ED Provider Notes (Signed)
 [place] EMERGENCY DEPARTMENT AT [facility] Provider Note  CSN: [id number] Arrival date & time: 03/22/24 1451  Chief Complaint(s) Seizures  HPI Tim Foley is a 33 y.o. male with PMH HTN who presents Emergency Department for evaluation of a seizure-like episode.  Patient was reportedly at work and states that he did not eat anything this morning.  Additional history obtained from coworkers who states that they found him on the ground stiff and shaking.  Unsure of postictal period because there is a onsite medical team that was managing his care for an extended period of time.  Here in the Emergency Department, he has returned to normal mental status baseline.  He is tremulous and hypertensive but denies alcohol use.  Denies illicit substance use.  No previous history of seizure before.   Past Medical History Past Medical History:  Diagnosis Date   Hypertension    There are no active problems to display for this patient.  Home Medication(s) Prior to Admission medications   Medication Sig Start Date End Date Taking? Authorizing Provider  atorvastatin (LIPITOR) 20 MG tablet Take 1 tablet (20 mg total) by mouth daily. 10/23/23   Nafziger, Kandee Keen, NP  diphenhydrAMINE (BENADRYL) 25 MG tablet Take 1 tablet (25 mg total) by mouth every 6 (six) hours. 02/13/24   Sabas Sous, MD  EPINEPHrine 0.3 mg/0.3 mL IJ SOAJ injection Inject 0.3 mg into the muscle as needed for anaphylaxis. 02/13/24   Sabas Sous, MD                                                                                                                                    Past Surgical History History reviewed. No pertinent surgical history. Family History Family History  Problem Relation Age of Onset   High blood pressure Mother    Heart attack Father    Colon cancer Maternal Grandmother    High blood pressure Maternal Grandmother    Diabetes Maternal Grandmother    Cancer - Colon Maternal Grandmother      Social History Social History   Tobacco Use   Smoking status: Never   Smokeless tobacco: Never  Vaping Use   Vaping status: Never Used  Substance Use Topics   Alcohol use: Yes    Alcohol/week: 4.0 standard drinks of alcohol    Types: 1 Glasses of wine, 1 Cans of beer, 2 Shots of liquor per week    Comment: occasionlly   Drug use: No   Allergies Patient has no known allergies.  Review of Systems Review of Systems  Neurological:  Positive for seizures.    Physical Exam Vital Signs  I have reviewed the triage vital signs BP (!) 177/110 (BP Location: Right Arm)   Pulse 100   Temp 98.4 F (36.9 C) (Oral)   Resp 18   Ht 6\' 2"  (1.88 m)   Wt 86.2 kg   SpO2 100%  BMI 24.39 kg/m   Physical Exam Vitals and nursing note reviewed.  Constitutional:      General: He is not in acute distress.    Appearance: He is well-developed.  HENT:     Head: Normocephalic and atraumatic.  Eyes:     Conjunctiva/sclera: Conjunctivae normal.  Cardiovascular:     Rate and Rhythm: Normal rate and regular rhythm.     Heart sounds: No murmur heard. Pulmonary:     Effort: Pulmonary effort is normal. No respiratory distress.     Breath sounds: Normal breath sounds.  Abdominal:     Palpations: Abdomen is soft.     Tenderness: There is no abdominal tenderness.  Musculoskeletal:        General: No swelling.     Cervical back: Neck supple.  Skin:    General: Skin is warm and dry.     Capillary Refill: Capillary refill takes less than 2 seconds.  Neurological:     General: No focal deficit present.     Mental Status: He is alert.     Cranial Nerves: No cranial nerve deficit.     Sensory: No sensory deficit.     Motor: No weakness.     Comments: Tremulous  Psychiatric:        Mood and Affect: Mood normal.     ED Results and Treatments Labs (all labs ordered are listed, but only abnormal results are displayed) Labs Reviewed  CBC WITH DIFFERENTIAL/PLATELET - Abnormal; Notable  for the following components:      Result Value   Hemoglobin 12.0 (*)    HCT 37.6 (*)    MCV 75.8 (*)    MCH 24.2 (*)    All other components within normal limits  COMPREHENSIVE METABOLIC PANEL - Abnormal; Notable for the following components:   Glucose, Bld 125 (*)    AST 271 (*)    ALT 129 (*)    Total Bilirubin 1.9 (*)    Anion gap 17 (*)    All other components within normal limits  CBG MONITORING, ED - Abnormal; Notable for the following components:   Glucose-Capillary 126 (*)    All other components within normal limits  MAGNESIUM  URINALYSIS, ROUTINE W REFLEX MICROSCOPIC  RAPID URINE DRUG SCREEN, HOSP PERFORMED                                                                                                                          Radiology DG Chest 2 View Result Date: 03/22/2024 CLINICAL DATA:  spasms. EXAM: CHEST - 2 VIEW COMPARISON:  07/10/2020. FINDINGS: Bilateral lung fields are clear. Bilateral costophrenic angles are clear. Normal cardio-mediastinal silhouette. No acute osseous abnormalities. The soft tissues are within normal limits. IMPRESSION: No active cardiopulmonary disease. Electronically Signed   By: Jules Schick M.D.   On: 03/22/2024 17:00    Pertinent labs & imaging results that were available during my care of the patient were reviewed by me and considered in  my medical decision making (see MDM for details).  Medications Ordered in ED Medications  lactated ringers bolus 1,000 mL (1,000 mLs Intravenous New Bag/Given 03/22/24 1632)                                                                                                                                     Procedures .Critical Care  Performed by: Glendora Score, MD Authorized by: Glendora Score, MD   Critical care provider statement:    Critical care time (minutes):  30   Critical care was necessary to treat or prevent imminent or life-threatening deterioration of the following conditions:  CNS  failure or compromise   Critical care was time spent personally by me on the following activities:  Development of treatment plan with patient or surrogate, discussions with consultants, evaluation of patient's response to treatment, examination of patient, ordering and review of laboratory studies, ordering and review of radiographic studies, ordering and performing treatments and interventions, pulse oximetry, re-evaluation of patient's condition and review of old charts   (including critical care time)  Medical Decision Making / ED Course   This patient presents to the ED for concern of seizure, this involves an extensive number of treatment options, and is a complaint that carries with it a high risk of complications and morbidity.  The differential diagnosis includes medication noncompliance, meningitis, posterior reversible encephalopathy syndrome, hyponatremia, convulsive syncope, focal lesion/mass, head trauma, intracerebral hemorrhage, toxins/recreational drugs, pseudoseizure  MDM: Patient seen emergency room for evaluation of a seizure.  Physical exam reveals a tremulous patient that is tachycardic and hypertensive but neurologic exam is unremarkable.  Laboratory evaluation with a hemoglobin of 12.0 with an MCV of 75.8, AST 271, ALT 129, total bili 1.9 but is otherwise unremarkable.  Patient had an episode of tonic-clonic seizure behavior here in the emergency department that self aborted and lasted approximately 30 seconds.  Ativan given and patient Keppra loaded.  Additional history then obtained from patient's girlfriend and family who states that the patient is actually a daily drinker and drinks at least 7 shots of liquor a day.  Mother states that he has been tremoring recently and been feeling poorly.  CT head is unremarkable.  CIWA protocol initiated and patient admitted for alcohol withdrawal and seizure.   Additional history obtained: -Additional history obtained from multiple  family members -External records from outside source obtained and reviewed including: Chart review including previous notes, labs, imaging, consultation notes   Lab Tests: -I ordered, reviewed, and interpreted labs.   The pertinent results include:   Labs Reviewed  CBC WITH DIFFERENTIAL/PLATELET - Abnormal; Notable for the following components:      Result Value   Hemoglobin 12.0 (*)    HCT 37.6 (*)    MCV 75.8 (*)    MCH 24.2 (*)    All other components within normal limits  COMPREHENSIVE METABOLIC PANEL - Abnormal; Notable for the following  components:   Glucose, Bld 125 (*)    AST 271 (*)    ALT 129 (*)    Total Bilirubin 1.9 (*)    Anion gap 17 (*)    All other components within normal limits  CBG MONITORING, ED - Abnormal; Notable for the following components:   Glucose-Capillary 126 (*)    All other components within normal limits  MAGNESIUM  URINALYSIS, ROUTINE W REFLEX MICROSCOPIC  RAPID URINE DRUG SCREEN, HOSP PERFORMED      EKG   EKG Interpretation Date/Time:  Monday March 22 2024 15:35:45 EDT Ventricular Rate:  100 PR Interval:  154 QRS Duration:  84 QT Interval:  346 QTC Calculation: 446 R Axis:   57  Text Interpretation: Normal sinus rhythm Biatrial enlargement Abnormal ECG When compared with ECG of 10-Jul-2020 17:32, PREVIOUS ECG IS PRESENT Confirmed by Naz Denunzio ([id number]) on 03/22/2024 5:05:04 PM         Imaging Studies ordered: I ordered imaging studies including CT head I independently visualized and interpreted imaging. I agree with the radiologist interpretation   Medicines ordered and prescription drug management: Meds ordered this encounter  Medications   lactated ringers bolus 1,000 mL    -I have reviewed the patients home medicines and have made adjustments as needed  Critical interventions Ativan, Keppra    Cardiac Monitoring: The patient was maintained on a cardiac monitor.  I personally viewed and interpreted the cardiac  monitored which showed an underlying rhythm of: NSR, sinus tachycardia  Social Determinants of Health:  Factors impacting patients care include: Daily alcohol use   Reevaluation: After the interventions noted above, I reevaluated the patient and found that they have :improved  Co morbidities that complicate the patient evaluation  Past Medical History:  Diagnosis Date   Hypertension       Dispostion: I considered admission for this patient, and patient require hospital mission for alcohol withdrawal and seizures     Final Clinical Impression(s) / ED Diagnoses Final diagnoses:  None     @PCDICTATION @    Glendora Score, MD 03/25/24 1313

## 2024-03-22 NOTE — H&P (Signed)
 History and Physical  Tim Foley UJW:[id number] DOB: [birth year] DOA: 03/22/2024  Referring physician: Dr. Posey Rea, EDP  PCP: Shirline Frees, NP  Outpatient Specialists: None Patient coming from: Home  Chief Complaint: Seizure activity.  HPI: Tim Foley is a 33 y.o. male with medical history significant for hypertension, hyperlipidemia, alcohol abuse, who presents to the ER via EMS due to witnessed seizure like activity today while at work.  He was caught by his coworkers preventing him to fall.  In the ER, the patient had another seizure-like activity with jerking-type movements lasting about 90 seconds.  Per his significant other at bedside, the patient drinks 6-7 shots of liquor per day.  He received loading dose of Keppra and 2 mg IV Ativan for breakthrough seizures.  Due to concern for alcohol withdrawal seizures, EDP requested admission.  Admitted by [facility], hospitalist service.  At the time of this visit, the patient is alert and oriented x 3.  Mild soreness where he bit his tongue.  Started on CIWA protocol.  LFTs are elevated, suspect from alcohol abuse.  ED Course: Temperature 98.  BP 151/95.  Pulse 83, respiration rate 18, O2 saturation 100% on room air.  Review of Systems: Review of systems as noted in the HPI. All other systems reviewed and are negative.   Past Medical History:  Diagnosis Date   Hypertension    History reviewed. No pertinent surgical history.  Social History:  reports that he has never smoked. He has never used smokeless tobacco. He reports current alcohol use of about 4.0 standard drinks of alcohol per week. He reports that he does not use drugs.   No Known Allergies  Family History  Problem Relation Age of Onset   High blood pressure Mother    Heart attack Father    Colon cancer Maternal Grandmother    High blood pressure Maternal Grandmother    Diabetes Maternal Grandmother    Cancer - Colon Maternal Grandmother       Prior to Admission  medications   Medication Sig Start Date End Date Taking? Authorizing Provider  atorvastatin (LIPITOR) 20 MG tablet Take 1 tablet (20 mg total) by mouth daily. 10/23/23  Yes Nafziger, Kandee Keen, NP  diphenhydrAMINE (BENADRYL) 25 MG tablet Take 1 tablet (25 mg total) by mouth every 6 (six) hours. 02/13/24  Yes Sabas Sous, MD  EPINEPHrine 0.3 mg/0.3 mL IJ SOAJ injection Inject 0.3 mg into the muscle as needed for anaphylaxis. 02/13/24  Yes Sabas Sous, MD    Physical Exam: BP (!) 161/101   Pulse (!) 107   Temp 98.1 F (36.7 C)   Resp 18   Ht 6\' 2"  (1.88 m)   Wt 86.2 kg   SpO2 93%   BMI 24.39 kg/m   General: 33 y.o. year-old male well developed well nourished in no acute distress.  Alert and oriented x3. Cardiovascular: Regular rate and rhythm with no rubs or gallops.  No thyromegaly or JVD noted.  No lower extremity edema. 2/4 pulses in all 4 extremities. Respiratory: Clear to auscultation with no wheezes or rales. Good inspiratory effort. Abdomen: Soft nontender nondistended with normal bowel sounds x4 quadrants. Muskuloskeletal: No cyanosis, clubbing or edema noted bilaterally Neuro: CN II-XII intact, strength, sensation, reflexes Skin: No ulcerative lesions noted or rashes Psychiatry: Judgement and insight appear normal. Mood is appropriate for condition and setting          Labs on Admission:  Basic Metabolic Panel: Recent Labs  Lab 03/22/24 1530  NA 137  K 3.7  CL 98  CO2 22  GLUCOSE 125*  BUN 9  CREATININE 0.94  CALCIUM 9.5  MG 1.8   Liver Function Tests: Recent Labs  Lab 03/22/24 1530  AST 271*  ALT 129*  ALKPHOS 44  BILITOT 1.9*  PROT 8.0  ALBUMIN 4.7   No results for input(s): "LIPASE", "AMYLASE" in the last 168 hours. No results for input(s): "AMMONIA" in the last 168 hours. CBC: Recent Labs  Lab 03/22/24 1530  WBC 7.9  NEUTROABS 6.4  HGB 12.0*  HCT 37.6*  MCV 75.8*  PLT 226   Cardiac Enzymes: No results for input(s): "CKTOTAL", "CKMB",  "CKMBINDEX", "TROPONINI" in the last 168 hours.  BNP (last 3 results) No results for input(s): "BNP" in the last 8760 hours.  ProBNP (last 3 results) No results for input(s): "PROBNP" in the last 8760 hours.  CBG: Recent Labs  Lab 03/22/24 1533  GLUCAP 126*    Radiological Exams on Admission: DG Chest 2 View Result Date: 03/22/2024 CLINICAL DATA:  spasms. EXAM: CHEST - 2 VIEW COMPARISON:  07/10/2020. FINDINGS: Bilateral lung fields are clear. Bilateral costophrenic angles are clear. Normal cardio-mediastinal silhouette. No acute osseous abnormalities. The soft tissues are within normal limits. IMPRESSION: No active cardiopulmonary disease. Electronically Signed   By: Jules Schick M.D.   On: 03/22/2024 17:00    EKG: I independently viewed the EKG done and my findings are as followed: Sinus tachycardia rate of 122.  Nonspecific ST-T changes.  QTc 399.  Assessment/Plan Present on Admission: **None**  Principal Problem:   Seizure (HCC)  Seizure-like activity in the setting of alcohol withdrawal Continue as needed Ativan for breakthrough seizures Complete alcohol cessation is recommended. CIWA protocol in place Seizure precautions also in place  Alcohol abuse with concern for alcohol withdrawal Per the patient's significant other at bedside, the patient drinks 6-7 shots of liquor per day. Alcohol withdrawal protocol in place, CIWA protocol Multivitamin, folic acid and thiamine supplement.  Elevated liver chemistries AST: ALT 2 to 1 ratio, T. bili elevated Suspect related to alcohol abuse Follow hepatitis viral panel  Avoid hepatotoxic agents Repeat CMP in the morning  Hypertension, BP is not at goal, elevated Start Norvasc 5 mg daily Monitor vital signs  Hyperlipidemia Resume home Lipitor   Time: 75 minutes.   DVT prophylaxis: Subcu Lovenox daily  Code Status: Full code  Family Communication: Significant other and father at bedside.  Disposition Plan:  Admitted to telemetry medical unit.  Consults called: None.    Admission status: Inpatient status.   Status is: Inpatient The patient requires at least 2 midnights for further evaluation and treatment of present condition.   Darlin Drop MD [facility] Pager [phone]  If 7PM-7AM, please contact night-coverage [email or web address] Password [facility]  03/22/2024, 7:42 PM

## 2024-03-22 NOTE — ED Triage Notes (Signed)
 Pt bib ems from work c/o seizure.   Pt had reached his arms out and started jerking. Pt didn't fall to ground d/t coworkers catching him. Pt jerking lasted four minutes. Staff stated he was confused and don't know what happened.   No hx seizures  Hx HTN   BP 152/97 HR 98 RR16 CBG 130 RA 98%

## 2024-03-23 ENCOUNTER — Inpatient Hospital Stay (HOSPITAL_COMMUNITY): Payer: Self-pay

## 2024-03-23 DIAGNOSIS — R7989 Other specified abnormal findings of blood chemistry: Secondary | ICD-10-CM

## 2024-03-23 DIAGNOSIS — E785 Hyperlipidemia, unspecified: Secondary | ICD-10-CM

## 2024-03-23 DIAGNOSIS — F1010 Alcohol abuse, uncomplicated: Secondary | ICD-10-CM

## 2024-03-23 LAB — COMPREHENSIVE METABOLIC PANEL
ALT: 102 U/L — ABNORMAL HIGH (ref 0–44)
AST: 164 U/L — ABNORMAL HIGH (ref 15–41)
Albumin: 4 g/dL (ref 3.5–5.0)
Alkaline Phosphatase: 46 U/L (ref 38–126)
Anion gap: 10 (ref 5–15)
BUN: 7 mg/dL (ref 6–20)
CO2: 23 mmol/L (ref 22–32)
Calcium: 9 mg/dL (ref 8.9–10.3)
Chloride: 102 mmol/L (ref 98–111)
Creatinine, Ser: 0.91 mg/dL (ref 0.61–1.24)
GFR, Estimated: >60 mL/min (ref >60)
Glucose, Bld: 81 mg/dL (ref 70–99)
Potassium: 3.7 mmol/L (ref 3.5–5.1)
Sodium: 135 mmol/L (ref 135–145)
Total Bilirubin: 2.6 mg/dL — ABNORMAL HIGH (ref 0.0–1.2)
Total Protein: 6.9 g/dL (ref 6.5–8.1)

## 2024-03-23 LAB — HIV ANTIBODY (ROUTINE TESTING W REFLEX): HIV Screen 4th Generation wRfx: NONREACTIVE

## 2024-03-23 LAB — CBC
HCT: 36.7 % — ABNORMAL LOW (ref 39.0–52.0)
Hemoglobin: 11.7 g/dL — ABNORMAL LOW (ref 13.0–17.0)
MCH: 24.1 pg — ABNORMAL LOW (ref 26.0–34.0)
MCHC: 31.9 g/dL (ref 30.0–36.0)
MCV: 75.7 fL — ABNORMAL LOW (ref 80.0–100.0)
Platelets: 179 10*3/uL (ref 150–400)
RBC: 4.85 MIL/uL (ref 4.22–5.81)
RDW: 14.8 % (ref 11.5–15.5)
WBC: 9.5 10*3/uL (ref 4.0–10.5)
nRBC: 0 % (ref 0.0–0.2)

## 2024-03-23 LAB — MAGNESIUM: Magnesium: 2 mg/dL (ref 1.7–2.4)

## 2024-03-23 LAB — PHOSPHORUS: Phosphorus: 3.4 mg/dL (ref 2.5–4.6)

## 2024-03-23 MED ORDER — LORAZEPAM 2 MG/ML IJ SOLN: 2.0000 mg | Freq: Four times a day (QID) | INTRAMUSCULAR | Status: DC | PRN

## 2024-03-23 MED ORDER — ORAL CARE MOUTH RINSE: 15.0000 mL | OROMUCOSAL | Status: DC | PRN

## 2024-03-23 NOTE — Progress Notes (Signed)
 PROGRESS NOTE    Tim Foley  ZOX:[id number] DOB: 01-21-1991 DOA: 03/22/2024 PCP: Shirline Frees, NP   Brief Narrative:  The patient is a 33 year old male with a past medical history significant for benign to essential hypertension, hyperlipidemia, alcohol abuse who presented to the ED due to witnessed seizure at work and then had another seizure while in the ED.  Patient drinks heavily and drinks about 6-7 shots of liquor per day.  Received a loading dose of Keppra and 2 mg of IV Ativan for breakthrough seizures.  Admitted for seizure evaluation and alcohol withdrawal and neurology been consulted.  EEG done and MRI pending.  Neurology recommends antiepileptics only if the either EEG or MRI shows a seizure predisposition.  Assessment and Plan:  Seizures, in the setting of Alcohol Use: Two witnessed Seizures. U/A showed slight Hgb and Pyuria. UDS negative Head CT done and showed no acute intracranial abnormality. MRI pending . Neuro consulted for further evaluation and recommending holding off Antiepileptics unless MRI or EEG suggestive of seizure predisposition.  Continue as needed Lorazepam 2 mg q6h for breakthrough seizures. Complete alcohol cessation is recommended and CIWA protocol in place. Seizure precautions also in place. EEG normal. Will need Seizure Precautions for D/C and no Driving for 6 months per [place]   Alcohol abuse with concern for alcohol withdrawal: Per the patient's significant other at bedside, the patient drinks 6-7 shots of liquor per day. EtOH <10. Alcohol withdrawal protocol in place, CIWA protocol initiated w/ Lorazepam 1-4 mg po/IV q1hprn Withdrawal Sx. C/w Folix Acid 1 mg po Daily, MVI + Minerals 1 tab po Daily, and Multivitamin, & Thiamine 100 mg po/IV. C/w Supportive care and getting IVF with LR @ 75 mL/hr   Abnormal LFTs: In the setting of Alcohol use/abuse. Improving. AST is now 164 and ALT is 102. Check Acute Hepatitis Panel and RUQ U/S. CTM and Trend and repeat  CMP in the AM  Hyperbilirubinemia: In the setting of Alcohol Use. T Bili went from 1.9 -> 2.6. CTM and Trend and Repeat CMP int he AM   Essential Hypertension, BP is not at goal, elevated: C/w Amlodipine 5 mg daily that was initiated on admission. CTM BP per Protocol. Last BP reading was 153/95   Hyperlipidemia: Hold Atorvastatin 20 mg po Daily given Abnormal LFTs and Resume when normalized   Hyperglycemia: Mild and likely reactive. Check HbA1c in the AM. If necessary will place on Sensitive Novolog SSI AC  Microcytic Anemia: Hgb/Hct went from 12.0/37.6 -> 11.7/36.7. MCV was 75.7. Check Anemia Panel in the AM. CTM for S/Sx of Bleeding; No overt bleeding noted. Repeat CBC in the AM    DVT prophylaxis: enoxaparin (LOVENOX) injection 40 mg Start: 03/22/24 2030    Code Status: Full Code Family Communication: Discussed with family at bedside  Disposition Plan:  Level of care: Telemetry Medical Status is: Inpatient Remains inpatient appropriate because: Seizure workup and clearance by the neurologist   Consultants:  Neurology  Procedures:  As delineated as above  Antimicrobials:  Anti-infectives (From admission, onward)    None       Subjective: Seen and examined at bedside and was resting in the bed and felt okay.  No nausea or vomiting.  Did bite his tongue earlier.  No other concerns or complaints at this time.  Objective: Vitals:   03/22/24 2145 03/23/24 0000 03/23/24 0515 03/23/24 0845  BP: (!) 169/97 (!) 151/95 (!) 161/90 (!) 153/95  Pulse: 100 83 92 85  Resp: 19  18 18 19   Temp: 98 F (36.7 C) 98 F (36.7 C) 99.9 F (37.7 C) 99.1 F (37.3 C)  TempSrc: Oral Oral Oral Oral  SpO2: 100% 100% 100% 100%  Weight:      Height:       No intake or output data in the 24 hours ending 03/23/24 1346 Filed Weights   03/22/24 1500  Weight: 86.2 kg   Examination: Physical Exam:  Constitutional: WN/WD young male in no acute distress Respiratory: Clear to auscultation  bilaterally, no wheezing, rales, rhonchi or crackles. Normal respiratory effort and patient is not tachypenic. No accessory muscle use.  Unlabored breathing Cardiovascular: RRR, no murmurs / rubs / gallops. S1 and S2 auscultated. No extremity edema. Abdomen: Soft, non-tender, non-distended.  Bowel sounds positive.  GU: Deferred. Musculoskeletal: No clubbing / cyanosis of digits/nails. No joint deformity upper and lower extremities.  Skin: No rashes, lesions, ulcers limited skin evaluation and has multiple tattoos scattered throughout his body and one on his chest. No induration; Warm and dry.  Neurologic: CN 2-12 grossly intact with no focal deficits. Romberg sign and cerebellar reflexes not assessed.  Psychiatric: Normal judgment and insight. Alert and oriented x 3. Normal mood and appropriate affect.   Data Reviewed: I have personally reviewed following labs and imaging studies  CBC: Recent Labs  Lab 03/22/24 1530 03/23/24 0535  WBC 7.9 9.5  NEUTROABS 6.4  --   HGB 12.0* 11.7*  HCT 37.6* 36.7*  MCV 75.8* 75.7*  PLT 226 179   Basic Metabolic Panel: Recent Labs  Lab 03/22/24 1530 03/23/24 0535  NA 137 135  K 3.7 3.7  CL 98 102  CO2 22 23  GLUCOSE 125* 81  BUN 9 7  CREATININE 0.94 0.91  CALCIUM 9.5 9.0  MG 1.8 2.0  PHOS  --  3.4   GFR: Estimated Creatinine Clearance: 136.7 mL/min (by C-G formula based on SCr of 0.91 mg/dL). Liver Function Tests: Recent Labs  Lab 03/22/24 1530 03/23/24 0535  AST 271* 164*  ALT 129* 102*  ALKPHOS 44 46  BILITOT 1.9* 2.6*  PROT 8.0 6.9  ALBUMIN 4.7 4.0   No results for input(s): "LIPASE", "AMYLASE" in the last 168 hours. No results for input(s): "AMMONIA" in the last 168 hours. Coagulation Profile: No results for input(s): "INR", "PROTIME" in the last 168 hours. Cardiac Enzymes: No results for input(s): "CKTOTAL", "CKMB", "CKMBINDEX", "TROPONINI" in the last 168 hours. BNP (last 3 results) No results for input(s): "PROBNP" in  the last 8760 hours. HbA1C: No results for input(s): "HGBA1C" in the last 72 hours. CBG: Recent Labs  Lab 03/22/24 1533  GLUCAP 126*   Lipid Profile: No results for input(s): "CHOL", "HDL", "LDLCALC", "TRIG", "CHOLHDL", "LDLDIRECT" in the last 72 hours. Thyroid Function Tests: No results for input(s): "TSH", "T4TOTAL", "FREET4", "T3FREE", "THYROIDAB" in the last 72 hours. Anemia Panel: No results for input(s): "VITAMINB12", "FOLATE", "FERRITIN", "TIBC", "IRON", "RETICCTPCT" in the last 72 hours. Sepsis Labs: No results for input(s): "PROCALCITON", "LATICACIDVEN" in the last 168 hours.  No results found for this or any previous visit (from the past 240 hours).   Radiology Studies: EEG adult Result Date: 03/23/2024 Charlsie Quest, MD     03/23/2024 10:44 AM Patient Name: Tim Foley MRN: [id number] Epilepsy Attending: Charlsie Quest Referring Physician/Provider: Merlene Laughter, DO Date: 03/23/2024 Duration: 22.22 mins Patient history: 33yo M with seizure-like activity in the setting of alcohol withdrawal. EEG to evaluate for seizure Level of alertness: Awake AEDs  during EEG study: None Technical aspects: This EEG study was done with scalp electrodes positioned according to the 10-20 International system of electrode placement. Electrical activity was reviewed with band pass filter of 1-70Hz , sensitivity of 7 uV/mm, display speed of 87mm/sec with a 60Hz  notched filter applied as appropriate. EEG data were recorded continuously and digitally stored.  Video monitoring was available and reviewed as appropriate. Description: The posterior dominant rhythm consists of 8-9 Hz activity of moderate voltage (25-35 uV) seen predominantly in posterior head regions, symmetric and reactive to eye opening and eye closing. Hyperventilation and photic stimulation were not performed.   IMPRESSION: This study is within normal limits. No seizures or epileptiform discharges were seen throughout the  recording. A normal interictal EEG does not exclude the diagnosis of epilepsy. Charlsie Quest   CT Head Wo Contrast Result Date: 03/22/2024 CLINICAL DATA:  Seizure, new-onset, no history of trauma EXAM: CT HEAD WITHOUT CONTRAST TECHNIQUE: Contiguous axial images were obtained from the base of the skull through the vertex without intravenous contrast. RADIATION DOSE REDUCTION: This exam was performed according to the departmental dose-optimization program which includes automated exposure control, adjustment of the mA and/or kV according to patient size and/or use of iterative reconstruction technique. COMPARISON:  None Available. FINDINGS: Brain: No evidence of large-territorial acute infarction. No parenchymal hemorrhage. No mass lesion. No extra-axial collection. No mass effect or midline shift. No hydrocephalus. Basilar cisterns are patent. Vascular: No hyperdense vessel. Skull: No acute fracture or focal lesion. Sinuses/Orbits: Paranasal sinuses and mastoid air cells are clear. The orbits are unremarkable. Other: None. IMPRESSION: No acute intracranial abnormality. Electronically Signed   By: Tish Frederickson M.D.   On: 03/22/2024 20:08   DG Chest 2 View Result Date: 03/22/2024 CLINICAL DATA:  spasms. EXAM: CHEST - 2 VIEW COMPARISON:  07/10/2020. FINDINGS: Bilateral lung fields are clear. Bilateral costophrenic angles are clear. Normal cardio-mediastinal silhouette. No acute osseous abnormalities. The soft tissues are within normal limits. IMPRESSION: No active cardiopulmonary disease. Electronically Signed   By: Jules Schick M.D.   On: 03/22/2024 17:00   Scheduled Meds:  enoxaparin (LOVENOX) injection  40 mg Subcutaneous Q24H   folic acid  1 mg Oral Daily   multivitamin with minerals  1 tablet Oral Daily   thiamine  100 mg Oral Daily   Or   thiamine  100 mg Intravenous Daily   Continuous Infusions:  lactated ringers 75 mL/hr at 03/23/24 7829    LOS: 1 day   Marguerita Merles, DO [facility] Available via Epic secure chat 7am-7pm After these hours, please refer to coverage provider listed on [email or web address] 03/23/2024, 1:46 PM

## 2024-03-23 NOTE — Plan of Care (Signed)
  Problem: Education: °Goal: Knowledge of General Education information will improve °Description: Including pain rating scale, medication(s)/side effects and non-pharmacologic comfort measures °Outcome: Progressing °  °Problem: Clinical Measurements: °Goal: Respiratory complications will improve °Outcome: Progressing °Goal: Cardiovascular complication will be avoided °Outcome: Progressing °  °Problem: Activity: °Goal: Risk for activity intolerance will decrease °Outcome: Progressing °  °Problem: Nutrition: °Goal: Adequate nutrition will be maintained °Outcome: Progressing °  °Problem: Coping: °Goal: Level of anxiety will decrease °Outcome: Progressing °  °Problem: Elimination: °Goal: Will not experience complications related to urinary retention °Outcome: Progressing °  °Problem: Safety: °Goal: Ability to remain free from injury will improve °Outcome: Progressing °  °Problem: Skin Integrity: °Goal: Risk for impaired skin integrity will decrease °Outcome: Progressing °  °

## 2024-03-23 NOTE — Progress Notes (Signed)
 EEG complete - results pending

## 2024-03-23 NOTE — Consult Note (Signed)
 NEUROLOGY CONSULT NOTE   Date of service: March 23, 2024 Patient Name: Tim Foley MRN:  [id number] DOB:  06-16-1991 Chief Complaint: "Seizure" Requesting Provider: Merlene Laughter, DO  History of Present Illness  Tim Foley is a 33 y.o. male with hx of hypercholesterolemia who presents with new onset seizures.  He was at work, and got no warning prior to having a seizure.  The first thing that let him know that something was out of the ordinary was waking up, prior to EMS arrival.  He was brought into the emergency department and while awaiting evaluation was noted to have a recurrent seizure.  His mother was with him with the second seizure, and describes bilateral extension of arms with inward rotation.  He did have a lateral tongue bite on the left side of his tongue.  He was given 2 mg of Ativan with a second seizure, and has not had any further seizures.  He does drink on a regular basis, buying 1-2 fifths of vodka per week, which averages out to about six drinks per day.  It is possible he drinks more than this.  He denies any antecedent illness, no fevers, no headaches, or other unusual symptoms.  He has an uncle with seizures secondary to a TBI.  He denies staring spells, episodes of loss time, or other episodes of concern prior to yesterday.  Past History   Past Medical History:  Diagnosis Date   Hypertension     History reviewed. No pertinent surgical history.  Family History: Family History  Problem Relation Age of Onset   High blood pressure Mother    Heart attack Father    Colon cancer Maternal Grandmother    High blood pressure Maternal Grandmother    Diabetes Maternal Grandmother    Cancer - Colon Maternal Grandmother     Social History  reports that he has never smoked. He has never used smokeless tobacco. He reports current alcohol use of about 4.0 standard drinks of alcohol per week. He reports that he does not use drugs.  No Known  Allergies  Medications   Current Facility-Administered Medications:    atorvastatin (LIPITOR) tablet 20 mg, 20 mg, Oral, Daily, Hall, Carole N, DO, 20 mg at 03/23/24 0856   enoxaparin (LOVENOX) injection 40 mg, 40 mg, Subcutaneous, Q24H, Hall, Carole N, DO   folic acid (FOLVITE) tablet 1 mg, 1 mg, Oral, Daily, Kommor, Madison, MD, 1 mg at 03/23/24 4098   lactated ringers infusion, , Intravenous, Continuous, Darlin Drop, DO, Last Rate: 75 mL/hr at 03/22/24 1955, New Bag at 03/22/24 1955   LORazepam (ATIVAN) tablet 1-4 mg, 1-4 mg, Oral, Q1H PRN **OR** LORazepam (ATIVAN) injection 1-4 mg, 1-4 mg, Intravenous, Q1H PRN, Kommor, Madison, MD   LORazepam (ATIVAN) injection 2 mg, 2 mg, Intravenous, Q6H PRN, Hall, Carole N, DO   melatonin tablet 5 mg, 5 mg, Oral, QHS PRN, Margo Aye, Carole N, DO   multivitamin with minerals tablet 1 tablet, 1 tablet, Oral, Daily, Kommor, Madison, MD, 1 tablet at 03/23/24 0856   polyethylene glycol (MIRALAX / GLYCOLAX) packet 17 g, 17 g, Oral, Daily PRN, Margo Aye, Carole N, DO   prochlorperazine (COMPAZINE) injection 5 mg, 5 mg, Intravenous, Q6H PRN, Margo Aye, Carole N, DO   thiamine (VITAMIN B1) tablet 100 mg, 100 mg, Oral, Daily, 100 mg at 03/23/24 0856 **OR** thiamine (VITAMIN B1) injection 100 mg, 100 mg, Intravenous, Daily, Kommor, Madison, MD, 100 mg at 03/22/24 1822  Vitals   Vitals:  18-Apr-2024 2145 03/23/24 0000 03/23/24 0515 03/23/24 0845  BP: (!) 169/97 (!) 151/95 (!) 161/90 (!) 153/95  Pulse: 100 83 92 85  Resp: 19 18 18 19   Temp: 98 F (36.7 C) 98 F (36.7 C) 99.9 F (37.7 C) 99.1 F (37.3 C)  TempSrc: Oral Oral Oral Oral  SpO2: 100% 100% 100% 100%  Weight:      Height:        Body mass index is 24.39 kg/m.  Physical Exam   Constitutional: Appears well-developed and well-nourished.   Neurologic Examination    Neuro: Mental Status: Patient is awake, alert, oriented to person, place, month, year, and situation. Patient is able to give a clear and  coherent history. No signs of aphasia or neglect Cranial Nerves: II: Visual Fields are full. Pupils are equal, round, and reactive to light.   III,IV, VI: EOMI without ptosis or diploplia.  V: Facial sensation is symmetric to temperature VII: Facial movement is symmetric.  VIII: hearing is intact to voice X: Uvula elevates symmetrically XII: tongue is midline without atrophy or fasciculations.  Motor: Tone is normal. Bulk is normal. 5/5 strength was present in all four extremities.  Sensory: Sensation is symmetric to light touch and temperature in the arms and legs. Cerebellar: FNF intact bilaterally        Labs/Imaging/Neurodiagnostic studies   CBC:  Recent Labs  Lab 18-Apr-2024 1530 03/23/24 0535  WBC 7.9 9.5  NEUTROABS 6.4  --   HGB 12.0* 11.7*  HCT 37.6* 36.7*  MCV 75.8* 75.7*  PLT 226 179   Basic Metabolic Panel:  Lab Results  Component Value Date   NA 135 03/23/2024   K 3.7 03/23/2024   CO2 23 03/23/2024   GLUCOSE 81 03/23/2024   BUN 7 03/23/2024   CREATININE 0.91 03/23/2024   CALCIUM 9.0 03/23/2024   GFRNONAA >60 03/23/2024   GFRAA >60 07/10/2020   Urine Drug Screen:     Component Value Date/Time   LABOPIA NONE DETECTED 04-18-2024 2019   COCAINSCRNUR NONE DETECTED 18-Apr-2024 2019   LABBENZ NONE DETECTED 04-18-24 2019   AMPHETMU NONE DETECTED 2024-04-18 2019   THCU NONE DETECTED 04/18/24 2019   LABBARB NONE DETECTED 18-Apr-2024 2019    Alcohol Level     Component Value Date/Time   [facility] <10 2024/04/18 1845    ASSESSMENT   Tim Foley is a 33 y.o. male with seizure in the setting of moderately heavy alcohol use.  It is not clear to me that he actually cut down on his alcohol use and therefore I am not certain that we can attribute this to alcohol withdrawal seizures.  He has had some hypertension and tachycardia, and therefore I do think it is possible this is alcohol withdrawal.  He is also less than totally forthcoming with his alcohol use  history, so I am not sure if there was some change in the days prior.  In any case, with two seizures within a single 24-hour period, this would be a first ever unprovoked seizure if this was considered unprovoked.  I would therefore not favor starting antiepileptic therapy at this time unless either MRI or EEG is suggestive of seizure predisposition.  RECOMMENDATIONS  MRI brain EEG CIWA protocol Antiepileptics only to be started if either EEG or MRI shows predisposition He should be informed not to drive for 6 months from his most recent seizure. ______________________________________________________________________    Stormy Card, MD [facility]

## 2024-03-23 NOTE — TOC CAGE-AID Note (Signed)
 Transition of Care [facility]) - CAGE-AID Screening   Patient Details  Name: Tim Foley MRN: [id number] Date of Birth: Jul 26, 1991  Transition of Care [facility]) CM/SW Contact:    Kermit Balo, RN Phone Number: 03/23/2024, 12:04 PM   Clinical Narrative:  Pt refused inpatient/ outpatient alcohol counseling resources.  CAGE-AID Screening:    Have You Ever Felt You Ought to Cut Down on Your Drinking or Drug Use?: Yes Have People Annoyed You By Critizing Your Drinking Or Drug Use?: No Have You Felt Bad Or Guilty About Your Drinking Or Drug Use?: No Have You Ever Had a Drink or Used Drugs First Thing In The Morning to Steady Your Nerves or to Get Rid of a Hangover?: No CAGE-AID Score: 1  Substance Abuse Education Offered: Yes (refused)  Substance abuse interventions: Patient Counseling

## 2024-03-23 NOTE — TOC Initial Note (Signed)
 Transition of Care [facility]) - Initial/Assessment Note    Patient Details  Name: Tim Foley MRN: [id number] Date of Birth: 1991/06/20  Transition of Care [facility]) CM/SW Contact:    Kermit Balo, RN Phone Number: 03/23/2024, 12:06 PM  Clinical Narrative:                  Pt is from home with a friend. Pt works day shift.  No DME.  Pt has friends that can assist with transportation. Pt was not taking any medications prior to admission. 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  TOC following.  Expected Discharge Plan: Home/Self Care Barriers to Discharge: Continued Medical Work up   Patient Goals and CMS Choice            Expected Discharge Plan and Services       Living arrangements for the past 2 months: Single Family Home                                      Prior Living Arrangements/Services Living arrangements for the past 2 months: Single Family Home Lives with:: Friends Patient language and need for interpreter reviewed:: Yes Do you feel safe going back to the place where you live?: Yes        Care giver support system in place?: No (comment)   Criminal Activity/Legal Involvement Pertinent to Current Situation/Hospitalization: No - Comment as needed  Activities of Daily Living   ADL Screening (condition at time of admission) Independently performs ADLs?: Yes (appropriate for developmental age) Is the patient deaf or have difficulty hearing?: No Does the patient have difficulty seeing, even when wearing glasses/contacts?: No Does the patient have difficulty concentrating, remembering, or making decisions?: No  Permission Sought/Granted                  Emotional Assessment Appearance:: Appears stated age Attitude/Demeanor/Rapport: Engaged Affect (typically observed): Accepting Orientation: : Oriented to Self, Oriented to Place, Oriented to  Time, Oriented to Situation Alcohol / Substance Use: Alcohol Use Psych Involvement: No  (comment)  Admission diagnosis:  Seizure (HCC) [R56.9] Alcohol withdrawal syndrome without complication [facility]) [F10.930] Patient Active Problem List   Diagnosis Date Noted   Seizure (HCC) 03/22/2024   PCP:  Shirline Frees, NP Pharmacy:   [facility] #[id number] [place], [place] - [address] AT [facility] OF [address] & [facility] [address] [place] [address] Phone: [phone] Fax: [phone]  [facility] #[id number] [place], [place] - [address] AT [facility] OF [address] & [facility] [place] [address] Phone: [phone] Fax: [phone]     Social Drivers of Health (SDOH) Social History: SDOH Screenings   Food Insecurity: No Food Insecurity (03/22/2024)  Housing: High Risk (03/22/2024)  Transportation Needs: No Transportation Needs (03/22/2024)  Utilities: Not At Risk (03/22/2024)  Tobacco Use: Low Risk  (03/22/2024)   SDOH Interventions:     Readmission Risk Interventions     No data to display

## 2024-03-23 NOTE — Procedures (Signed)
 Patient Name: PHONG ISENBERG  MRN: [id number]  Epilepsy Attending: Charlsie Quest  Referring Physician/Provider: Merlene Laughter, DO  Date: 03/23/2024 Duration: 22.22 mins  Patient history: 33yo M with seizure-like activity in the setting of alcohol withdrawal. EEG to evaluate for seizure  Level of alertness: Awake  AEDs during EEG study: None  Technical aspects: This EEG study was done with scalp electrodes positioned according to the 10-20 International system of electrode placement. Electrical activity was reviewed with band pass filter of 1-70Hz , sensitivity of 7 uV/mm, display speed of 64mm/sec with a 60Hz  notched filter applied as appropriate. EEG data were recorded continuously and digitally stored.  Video monitoring was available and reviewed as appropriate.  Description: The posterior dominant rhythm consists of 8-9 Hz activity of moderate voltage (25-35 uV) seen predominantly in posterior head regions, symmetric and reactive to eye opening and eye closing. Hyperventilation and photic stimulation were not performed.     IMPRESSION: This study is within normal limits. No seizures or epileptiform discharges were seen throughout the recording.  A normal interictal EEG does not exclude the diagnosis of epilepsy.  Rhayne Chatwin Annabelle Harman

## 2024-03-23 NOTE — Hospital Course (Addendum)
 The patient is a 33 year old male with a past medical history significant for benign to essential hypertension, hyperlipidemia, alcohol abuse who presented to the ED due to witnessed seizure at work and then had another seizure while in the ED.  Patient drinks heavily and drinks about 6-7 shots of liquor per day.  Received a loading dose of Keppra and 2 mg of IV Ativan for breakthrough seizures.  Admitted for seizure evaluation and alcohol withdrawal and neurology been consulted.  EEG done and MRI pending.  Neurology recommends antiepileptics only if the either EEG or MRI shows a seizure predisposition.  Assessment and Plan:  Seizures, in the setting of Alcohol Use: Two witnessed Seizures. U/A showed slight Hgb and Pyuria. UDS negative Head CT done and showed no acute intracranial abnormality. MRI pending . Neuro consulted for further evaluation and recommending holding off Antiepileptics unless MRI or EEG suggestive of seizure predisposition.  Continue as needed Lorazepam 2 mg q6h for breakthrough seizures. Complete alcohol cessation is recommended and CIWA protocol in place. Seizure precautions also in place. EEG normal. Will need Seizure Precautions for D/C and no Driving for 6 months per [place] Law   Alcohol abuse with concern for alcohol withdrawal: Per the patient's significant other at bedside, the patient drinks 6-7 shots of liquor per day. EtOH <10. Alcohol withdrawal protocol in place, CIWA protocol initiated w/ Lorazepam 1-4 mg po/IV q1hprn Withdrawal Sx. C/w Folix Acid 1 mg po Daily, MVI + Minerals 1 tab po Daily, and Multivitamin, & Thiamine 100 mg po/IV. C/w Supportive care and getting IVF with LR @ 75 mL/hr   Abnormal LFTs: In the setting of Alcohol use/abuse. Improving. AST is now 164 and ALT is 102. Check Acute Hepatitis Panel and RUQ U/S. CTM and Trend and repeat CMP in the AM  Hyperbilirubinemia: In the setting of Alcohol Use. T Bili went from 1.9 -> 2.6. CTM and Trend and Repeat CMP int he  AM   Essential Hypertension, BP is not at goal, elevated: C/w Amlodipine 5 mg daily that was initiated on admission. CTM BP per Protocol. Last BP reading was 153/95   Hyperlipidemia: Hold Atorvastatin 20 mg po Daily given Abnormal LFTs and Resume when normalized   Hyperglycemia: Mild and likely reactive. Check HbA1c in the AM. If necessary will place on Sensitive Novolog SSI AC  Microcytic Anemia: Hgb/Hct went from 12.0/37.6 -> 11.7/36.7. MCV was 75.7. Check Anemia Panel in the AM. CTM for S/Sx of Bleeding; No overt bleeding noted. Repeat CBC in the AM

## 2024-03-23 NOTE — Plan of Care (Signed)

## 2024-03-24 ENCOUNTER — Other Ambulatory Visit (HOSPITAL_COMMUNITY): Payer: Self-pay

## 2024-03-24 DIAGNOSIS — R569 Unspecified convulsions: Secondary | ICD-10-CM | POA: Diagnosis not present

## 2024-03-24 DIAGNOSIS — F10939 Unspecified convulsions: Secondary | ICD-10-CM

## 2024-03-24 LAB — HEPATITIS PANEL, ACUTE
HCV Ab: NONREACTIVE
Hep A IgM: NONREACTIVE
Hep B C IgM: NONREACTIVE
Hepatitis B Surface Ag: NONREACTIVE

## 2024-03-24 MED ORDER — MENTHOL 3 MG MT LOZG
1.0000 | LOZENGE | OROMUCOSAL | Status: DC | PRN
Start: 2024-03-24 — End: 2024-03-24

## 2024-03-24 MED ORDER — AMLODIPINE BESYLATE 5 MG PO TABS
5.0000 mg | ORAL_TABLET | Freq: Every day | ORAL | 0 refills | Status: DC
  Filled 2024-03-24: qty 30, 30d supply, fill #0

## 2024-03-24 NOTE — TOC Transition Note (Signed)
 Transition of Care [facility]) - Discharge Note   Patient Details  Name: Tim Foley MRN: [id number] Date of Birth: [birth year]  Transition of Care [facility]) CM/SW Contact:  Kermit Balo, RN Phone Number: 03/24/2024, 10:03 AM   Clinical Narrative:     Pt is discharging home with self care. No further needs per TOC.  Final next level of care: Home/Self Care Barriers to Discharge: No Barriers Identified   Patient Goals and CMS Choice            Discharge Placement                       Discharge Plan and Services Additional resources added to the After Visit Summary for                                       Social Drivers of Health (SDOH) Interventions SDOH Screenings   Food Insecurity: No Food Insecurity (03/22/2024)  Housing: High Risk (03/22/2024)  Transportation Needs: No Transportation Needs (03/22/2024)  Utilities: Not At Risk (03/22/2024)  Tobacco Use: Low Risk  (03/22/2024)     Readmission Risk Interventions     No data to display

## 2024-03-24 NOTE — Plan of Care (Signed)

## 2024-03-24 NOTE — Discharge Instructions (Signed)
If patient has another seizure, call 911 and bring them back to the ED if: A.  The seizure lasts longer than 5 minutes.      B.  The patient doesn't wake shortly after the seizure or has new problems such as difficulty seeing, speaking or moving following the seizure C.  The patient was injured during the seizure D.  The patient has a temperature over 102 F (39C) E.  The patient vomited during the seizure and now is having trouble breathing    During the Seizure   - First, ensure adequate ventilation and place patients on the floor on their left side  Loosen clothing around the neck and ensure the airway is patent. If the patient is clenching the teeth, do not force the mouth open with any object as this can cause severe damage - Remove all items from the surrounding that can be hazardous. The patient may be oblivious to what's happening and may not even know what he or she is doing. If the patient is confused and wandering, either gently guide him/her away and block access to outside areas - Reassure the individual and be comforting - Call 911. In most cases, the seizure ends before EMS arrives. However, there are cases when seizures may last over 3 to 5 minutes. Or the individual may have developed breathing difficulties or severe injuries. If a pregnant patient or a person with diabetes develops a seizure, it is prudent to call an ambulance. - Finally, if the patient does not regain full consciousness, then call EMS. Most patients will remain confused for about 45 to 90 minutes after a seizure, so you must use judgment in calling for help. - Avoid restraints but make sure the patient is in a bed with padded side rails - Place the individual in a lateral position with the neck slightly flexed; this will help the saliva drain from the mouth and prevent the tongue from falling backward - Remove all nearby furniture and other hazards from the area - Provide verbal assurance as the individual is  regaining consciousness - Provide the patient with privacy if possible - Call for help and start treatment as ordered by the caregiver    After the Seizure (Postictal Stage)   After a seizure, most patients experience confusion, fatigue, muscle pain and/or a headache. Thus, one should permit the individual to sleep. For the next few days, reassurance is essential. Being calm and helping reorient the person is also of importance.   Most seizures are painless and end spontaneously. Seizures are not harmful to others but can lead to complications such as stress on the lungs, brain and the heart. Individuals with prior lung problems may develop labored breathing and respiratory distress.  -Per [facility] statutes, patients with seizures are not allowed to drive until they have been seizure-free for six months. Use caution when using heavy equipment or power tools. Avoid working on ladders or at heights. Take showers instead of baths. Ensure the water temperature is not too high on the home water heater. Do not go swimming alone. Do not lock yourself in a room alone (i.e. bathroom). When caring for infants or small children, sit down when holding, feeding, or changing them to minimize risk of injury to the child in the event you have a seizure. Maintain good sleep hygiene. Avoid alcohol.

## 2024-03-24 NOTE — Progress Notes (Signed)
 AVS instructions given, patient verbalized understanding, dropped to main Entrance A to girlfriend's car

## 2024-03-24 NOTE — Discharge Summary (Signed)
 Physician Discharge Summary  Tim Foley XBJ:[id number] DOB: 1991/09/23 DOA: 03/22/2024  PCP: Shirline Frees, NP  Admit date: 03/22/2024 Discharge date: 03/24/2024 30 Day Unplanned Readmission Risk Score    Flowsheet Row ED to Hosp-Admission (Current) from 03/22/2024 in [facility]  30 Day Unplanned Readmission Risk Score (%) 11.99 Filed at 03/24/2024 0801       This score is the patient's risk of an unplanned readmission within 30 days of being discharged (0 -100%). The score is based on dignosis, age, lab data, medications, orders, and past utilization.   Low:  0-14.9   Medium: 15-21.9   High: 22-29.9   Extreme: 30 and above          Admitted From: Home Disposition: Home  Recommendations for Outpatient Follow-up:  Follow up with PCP in 1-2 weeks Please obtain BMP/CBC in one week Please follow up with your PCP on the following pending results: Unresulted Labs (From admission, onward)     Start     Ordered   03/29/24 0500  Creatinine, serum  (enoxaparin (LOVENOX)    CrCl >/= 30 ml/min)  Weekly,   R     Comments: while on enoxaparin therapy    03/22/24 1937              Home Health: None Equipment/Devices: None  Discharge Condition: Stable CODE STATUS: Full code Diet recommendation: Cardiac  Subjective: Seen and examined, wife at the bedside.  Patient has no complaints at all.  Of discharge discussed with the patient.  I personally instructed him not to drive for neck 6 months per [facility] law due to seizure.  Also advised him to quit alcohol.  Brief/Interim Summary: The patient is a 33 year old male with a past medical history significant for benign to essential hypertension, hyperlipidemia, alcohol abuse who presented to the ED due to witnessed seizure at work and then had another seizure while in the ED.  Patient drinks heavily and drinks about 6-7 shots of liquor per day.  Received a loading dose of Keppra and 2 mg of IV Ativan for  breakthrough seizures.  Admitted for seizure evaluation and alcohol withdrawal and neurology consulted.  Seen by neurology.  EEG and MRI unremarkable.  They presumed seizure secondary to alcohol withdrawal.  Patient was not started on any antiepileptics.  Patient has not had any further seizures since admission and the one he had in the ED.  Complete alcohol cessation is recommended.  Patient has not had any withdrawal symptoms and has not required any as needed Ativan in last 24 hours.  Neurology cleared the patient for discharge.  He has been advised to not drive for the next 6 months per [facility] law until he is cleared by a neurologist or PCP.   Alcohol abuse with concern for alcohol withdrawal: Per the patient's significant other at bedside, the patient drinks 6-7 shots of liquor per day. EtOH <10. Alcohol withdrawal protocol in place, CIWA protocol initiated w/ Lorazepam 1-4 mg po/IV q1hprn Withdrawal Sx. C/w Folix Acid 1 mg po Daily, MVI + Minerals 1 tab po Daily, and Multivitamin, & Thiamine 100 mg po/IV.  No withdrawal symptoms.   Alcoholic hepatitis/abnormal LFTs: LFTs improving.   Hyperbilirubinemia: In the setting of Alcohol Use. T Bili went from 1.9 -> 2.6.  Yesterday.  I have not checked today.  Repeat CMP at PCPs office in 1 week.   Essential Hypertension, BP is not at goal, elevated: Does not appear to be on  any medications PTA.  Per previous hospitalist note, patient was supposed to be on amlodipine 5 mg but he is not on any medications.  Diastolic blood pressure still elevated.  I will discharge him on amlodipine 5 mg p.o. daily.   Hyperlipidemia: Hold Atorvastatin 20 mg po Daily given Abnormal LFTs and Resume when normalized    Hyperglycemia: Mild and likely reactive.  Per previous hospitalist note, hemoglobin A1c was supposed to be checked but no orders were placed.  His blood sugar was only 81 yesterday on CMP, I doubt he has any prediabetes either.   Microcytic Anemia:  Hemoglobin stable.  If patient has another seizure, call 911 and bring them back to the ED if: A.  The seizure lasts longer than 5 minutes.      B.  The patient doesn't wake shortly after the seizure or has new problems such as difficulty seeing, speaking or moving following the seizure C.  The patient was injured during the seizure D.  The patient has a temperature over 102 F (39C) E.  The patient vomited during the seizure and now is having trouble breathing    During the Seizure   - First, ensure adequate ventilation and place patients on the floor on their left side  Loosen clothing around the neck and ensure the airway is patent. If the patient is clenching the teeth, do not force the mouth open with any object as this can cause severe damage - Remove all items from the surrounding that can be hazardous. The patient may be oblivious to what's happening and may not even know what he or she is doing. If the patient is confused and wandering, either gently guide him/her away and block access to outside areas - Reassure the individual and be comforting - Call 911. In most cases, the seizure ends before EMS arrives. However, there are cases when seizures may last over 3 to 5 minutes. Or the individual may have developed breathing difficulties or severe injuries. If a pregnant patient or a person with diabetes develops a seizure, it is prudent to call an ambulance. - Finally, if the patient does not regain full consciousness, then call EMS. Most patients will remain confused for about 45 to 90 minutes after a seizure, so you must use judgment in calling for help. - Avoid restraints but make sure the patient is in a bed with padded side rails - Place the individual in a lateral position with the neck slightly flexed; this will help the saliva drain from the mouth and prevent the tongue from falling backward - Remove all nearby furniture and other hazards from the area - Provide verbal assurance as  the individual is regaining consciousness - Provide the patient with privacy if possible - Call for help and start treatment as ordered by the caregiver    After the Seizure (Postictal Stage)   After a seizure, most patients experience confusion, fatigue, muscle pain and/or a headache. Thus, one should permit the individual to sleep. For the next few days, reassurance is essential. Being calm and helping reorient the person is also of importance.   Most seizures are painless and end spontaneously. Seizures are not harmful to others but can lead to complications such as stress on the lungs, brain and the heart. Individuals with prior lung problems may develop labored breathing and respiratory distress.  -Per [facility] statutes, patients with seizures are not allowed to drive until they have been seizure-free for six months. Use caution  when using heavy equipment or power tools. Avoid working on ladders or at heights. Take showers instead of baths. Ensure the water temperature is not too high on the home water heater. Do not go swimming alone. Do not lock yourself in a room alone (i.e. bathroom). When caring for infants or small children, sit down when holding, feeding, or changing them to minimize risk of injury to the child in the event you have a seizure. Maintain good sleep hygiene. Avoid alcohol. Discharge plan was discussed with patient and/or family member and they verbalized understanding and agreed with it.  Discharge Diagnoses:  Principal Problem:   Seizure [facility]) Active Problems:   Seizure due to alcohol withdrawal [facility])    Discharge Instructions   Allergies as of 03/24/2024   No Known Allergies      Medication List     PAUSE taking these medications    atorvastatin 20 MG tablet Wait to take this until: April 02, 2024 Commonly known as: LIPITOR Take 1 tablet (20 mg total) by mouth daily.       TAKE these medications    amLODipine 5 MG tablet Commonly known  as: NORVASC Take 1 tablet (5 mg total) by mouth daily.   diphenhydrAMINE 25 MG tablet Commonly known as: BENADRYL Take 1 tablet (25 mg total) by mouth every 6 (six) hours.   EPINEPHrine 0.3 mg/0.3 mL Soaj injection Commonly known as: EPI-PEN Inject 0.3 mg into the muscle as needed for anaphylaxis.        Follow-up Information     Nafziger, Kandee Keen, NP Follow up in 1 week(s).   Specialty: Family Medicine Contact information: [address] [place] [address] [phone]                No Known Allergies  Consultations: Neurology   Procedures/Studies: MR BRAIN WO CONTRAST Result Date: 03/23/2024 CLINICAL DATA:  Seizure, new-onset, no history of trauma EXAM: MRI HEAD WITHOUT CONTRAST TECHNIQUE: Multiplanar, multiecho pulse sequences of the brain and surrounding structures were obtained without intravenous contrast. COMPARISON:  CT head March 24, 25. FINDINGS: Brain: No acute infarction, hemorrhage, hydrocephalus, extra-axial collection or mass lesion. Vascular: Major arterial flow voids are maintained at the skull base. Skull and upper cervical spine: Normal marrow signal. Sinuses/Orbits: Mostly clear sinuses.  No acute orbital findings. Other: No mastoid effusions. IMPRESSION: No acute intracranial abnormality. Electronically Signed   By: Feliberto Harts M.D.   On: 03/23/2024 21:48   EEG adult Result Date: 03/23/2024 Charlsie Quest, MD     03/23/2024 10:44 AM Patient Name: TAMON PARKERSON MRN: [id number] Epilepsy Attending: Charlsie Quest Referring Physician/Provider: Merlene Laughter, DO Date: 03/23/2024 Duration: 22.22 mins Patient history: 33yo M with seizure-like activity in the setting of alcohol withdrawal. EEG to evaluate for seizure Level of alertness: Awake AEDs during EEG study: None Technical aspects: This EEG study was done with scalp electrodes positioned according to the 10-20 International system of electrode placement. Electrical activity was  reviewed with band pass filter of 1-70Hz , sensitivity of 7 uV/mm, display speed of 39mm/sec with a 60Hz  notched filter applied as appropriate. EEG data were recorded continuously and digitally stored.  Video monitoring was available and reviewed as appropriate. Description: The posterior dominant rhythm consists of 8-9 Hz activity of moderate voltage (25-35 uV) seen predominantly in posterior head regions, symmetric and reactive to eye opening and eye closing. Hyperventilation and photic stimulation were not performed.   IMPRESSION: This study is within normal limits. No seizures  or epileptiform discharges were seen throughout the recording. A normal interictal EEG does not exclude the diagnosis of epilepsy. Charlsie Quest   CT Head Wo Contrast Result Date: 03/22/2024 CLINICAL DATA:  Seizure, new-onset, no history of trauma EXAM: CT HEAD WITHOUT CONTRAST TECHNIQUE: Contiguous axial images were obtained from the base of the skull through the vertex without intravenous contrast. RADIATION DOSE REDUCTION: This exam was performed according to the departmental dose-optimization program which includes automated exposure control, adjustment of the mA and/or kV according to patient size and/or use of iterative reconstruction technique. COMPARISON:  None Available. FINDINGS: Brain: No evidence of large-territorial acute infarction. No parenchymal hemorrhage. No mass lesion. No extra-axial collection. No mass effect or midline shift. No hydrocephalus. Basilar cisterns are patent. Vascular: No hyperdense vessel. Skull: No acute fracture or focal lesion. Sinuses/Orbits: Paranasal sinuses and mastoid air cells are clear. The orbits are unremarkable. Other: None. IMPRESSION: No acute intracranial abnormality. Electronically Signed   By: Tish Frederickson M.D.   On: 03/22/2024 20:08   DG Chest 2 View Result Date: 03/22/2024 CLINICAL DATA:  spasms. EXAM: CHEST - 2 VIEW COMPARISON:  07/10/2020. FINDINGS: Bilateral lung fields  are clear. Bilateral costophrenic angles are clear. Normal cardio-mediastinal silhouette. No acute osseous abnormalities. The soft tissues are within normal limits. IMPRESSION: No active cardiopulmonary disease. Electronically Signed   By: Jules Schick M.D.   On: 03/22/2024 17:00     Discharge Exam: Vitals:   03/24/24 0401 03/24/24 0827  BP: 129/87 (!) 142/91  Pulse: 84 84  Resp: 18 18  Temp: 99 F (37.2 C) 98.4 F (36.9 C)  SpO2: 99% 100%   Vitals:   03/23/24 2100 03/23/24 2359 03/24/24 0401 03/24/24 0827  BP: (!) 160/100 (!) 145/79 129/87 (!) 142/91  Pulse: 95 81 84 84  Resp: 18 18 18 18   Temp: 99.4 F (37.4 C) 99.2 F (37.3 C) 99 F (37.2 C) 98.4 F (36.9 C)  TempSrc: Oral Oral Oral Oral  SpO2: 99% 100% 99% 100%  Weight:      Height:        General: Pt is alert, awake, not in acute distress Cardiovascular: RRR, S1/S2 +, no rubs, no gallops Respiratory: CTA bilaterally, no wheezing, no rhonchi Abdominal: Soft, NT, ND, bowel sounds + Extremities: no edema, no cyanosis    The results of significant diagnostics from this hospitalization (including imaging, microbiology, ancillary and laboratory) are listed below for reference.     Microbiology: No results found for this or any previous visit (from the past 240 hours).   Labs: BNP (last 3 results) No results for input(s): "BNP" in the last 8760 hours. Basic Metabolic Panel: Recent Labs  Lab 03/22/24 1530 03/23/24 0535  NA 137 135  K 3.7 3.7  CL 98 102  CO2 22 23  GLUCOSE 125* 81  BUN 9 7  CREATININE 0.94 0.91  CALCIUM 9.5 9.0  MG 1.8 2.0  PHOS  --  3.4   Liver Function Tests: Recent Labs  Lab 03/22/24 1530 03/23/24 0535  AST 271* 164*  ALT 129* 102*  ALKPHOS 44 46  BILITOT 1.9* 2.6*  PROT 8.0 6.9  ALBUMIN 4.7 4.0   No results for input(s): "LIPASE", "AMYLASE" in the last 168 hours. No results for input(s): "AMMONIA" in the last 168 hours. CBC: Recent Labs  Lab 03/22/24 1530  03/23/24 0535  WBC 7.9 9.5  NEUTROABS 6.4  --   HGB 12.0* 11.7*  HCT 37.6* 36.7*  MCV 75.8* 75.7*  PLT 226 179   Cardiac Enzymes: No results for input(s): "CKTOTAL", "CKMB", "CKMBINDEX", "TROPONINI" in the last 168 hours. BNP: Invalid input(s): "POCBNP" CBG: Recent Labs  Lab 03/22/24 1533  GLUCAP 126*   D-Dimer No results for input(s): "DDIMER" in the last 72 hours. Hgb A1c No results for input(s): "HGBA1C" in the last 72 hours. Lipid Profile No results for input(s): "CHOL", "HDL", "LDLCALC", "TRIG", "CHOLHDL", "LDLDIRECT" in the last 72 hours. Thyroid function studies No results for input(s): "TSH", "T4TOTAL", "T3FREE", "THYROIDAB" in the last 72 hours.  Invalid input(s): "FREET3" Anemia work up No results for input(s): "VITAMINB12", "FOLATE", "FERRITIN", "TIBC", "IRON", "RETICCTPCT" in the last 72 hours. Urinalysis    Component Value Date/Time   COLORURINE YELLOW 03/22/2024 2019   APPEARANCEUR CLEAR 03/22/2024 2019   LABSPEC 1.020 03/22/2024 2019   PHURINE 6.0 03/22/2024 2019   GLUCOSEU NEGATIVE 03/22/2024 2019   HGBUR SMALL (A) 03/22/2024 2019   BILIRUBINUR NEGATIVE 03/22/2024 2019   KETONESUR 5 (A) 03/22/2024 2019   PROTEINUR 100 (A) 03/22/2024 2019   NITRITE NEGATIVE 03/22/2024 2019   LEUKOCYTESUR NEGATIVE 03/22/2024 2019   Sepsis Labs Recent Labs  Lab 03/22/24 1530 03/23/24 0535  WBC 7.9 9.5   Microbiology No results found for this or any previous visit (from the past 240 hours).  FURTHER DISCHARGE INSTRUCTIONS:   Get Medicines reviewed and adjusted: Please take all your medications with you for your next visit with your Primary MD   Laboratory/radiological data: Please request your Primary MD to go over all hospital tests and procedure/radiological results at the follow up, please ask your Primary MD to get all Hospital records sent to his/her office.   In some cases, they will be blood work, cultures and biopsy results pending at the time of  your discharge. Please request that your primary care M.D. goes through all the records of your hospital data and follows up on these results.   Also Note the following: If you experience worsening of your admission symptoms, develop shortness of breath, life threatening emergency, suicidal or homicidal thoughts you must seek medical attention immediately by calling 911 or calling your MD immediately  if symptoms less severe.   You must read complete instructions/literature along with all the possible adverse reactions/side effects for all the Medicines you take and that have been prescribed to you. Take any new Medicines after you have completely understood and accpet all the possible adverse reactions/side effects.    Do not drive when taking Pain medications or sleeping medications (Benzodaizepines)   Do not take more than prescribed Pain, Sleep and Anxiety Medications. It is not advisable to combine anxiety,sleep and pain medications without talking with your primary care practitioner   Special Instructions: If you have smoked or chewed Tobacco  in the last 2 yrs please stop smoking, stop any regular Alcohol  and or any Recreational drug use.   Wear Seat belts while driving.   Please note: You were cared for by a hospitalist during your hospital stay. Once you are discharged, your primary care physician will handle any further medical issues. Please note that NO REFILLS for any discharge medications will be authorized once you are discharged, as it is imperative that you return to your primary care physician (or establish a relationship with a primary care physician if you do not have one) for your post hospital discharge needs so that they can reassess your need for medications and monitor your lab values  Time coordinating discharge: Over 30 minutes  SIGNED:   Hughie Closs, MD  [facility] 03/24/2024, 9:06 AM *Please note that this is a verbal dictation therefore any spelling or  grammatical errors are due to the "[employer] One" system interpretation. If 7PM-7AM, please contact night-coverage [email or web address]

## 2024-03-25 ENCOUNTER — Telehealth: Payer: Self-pay

## 2024-03-25 NOTE — Transitions of Care (Post Inpatient/ED Visit) (Signed)
   03/25/2024  Name: Tim Foley MRN: [id number] DOB: Aug 20, 1991  Today's TOC FU Call Status: Today's TOC FU Call Status:: Unsuccessful Call (1st Attempt) Unsuccessful Call (1st Attempt) Date: 03/25/24  Attempted to reach the patient regarding the most recent Inpatient/ED visit.  Follow Up Plan: Additional outreach attempts will be made to reach the patient to complete the Transitions of Care (Post Inpatient/ED visit) call.   Signature Karena Addison, LPN [facility] Nurse Health Advisor Direct Dial [phone]

## 2024-03-26 NOTE — Transitions of Care (Post Inpatient/ED Visit) (Signed)
   03/26/2024  Name: RANVIR RENOVATO MRN: [id number] DOB: 11/24/1991  Today's TOC FU Call Status: Today's TOC FU Call Status:: Unsuccessful Call (2nd Attempt) Unsuccessful Call (1st Attempt) Date: 03/25/24 Unsuccessful Call (2nd Attempt) Date: 03/26/24  Attempted to reach the patient regarding the most recent Inpatient/ED visit.  Follow Up Plan: Additional outreach attempts will be made to reach the patient to complete the Transitions of Care (Post Inpatient/ED visit) call.   Signature Karena Addison, LPN [facility] Nurse Health Advisor Direct Dial [phone]

## 2024-03-30 ENCOUNTER — Encounter: Payer: Self-pay | Admitting: Adult Health

## 2024-03-30 ENCOUNTER — Ambulatory Visit (INDEPENDENT_AMBULATORY_CARE_PROVIDER_SITE_OTHER): Payer: Self-pay | Admitting: Adult Health

## 2024-03-30 VITALS — BP 138/70 | HR 113 | Temp 98.4°F | Ht 74.0 in | Wt 183.0 lb

## 2024-03-30 DIAGNOSIS — E782 Mixed hyperlipidemia: Secondary | ICD-10-CM

## 2024-03-30 DIAGNOSIS — R7989 Other specified abnormal findings of blood chemistry: Secondary | ICD-10-CM

## 2024-03-30 DIAGNOSIS — R739 Hyperglycemia, unspecified: Secondary | ICD-10-CM

## 2024-03-30 DIAGNOSIS — R569 Unspecified convulsions: Secondary | ICD-10-CM

## 2024-03-30 DIAGNOSIS — I10 Essential (primary) hypertension: Secondary | ICD-10-CM | POA: Diagnosis not present

## 2024-03-30 DIAGNOSIS — F10930 Alcohol use, unspecified with withdrawal, uncomplicated: Secondary | ICD-10-CM

## 2024-03-30 DIAGNOSIS — D509 Iron deficiency anemia, unspecified: Secondary | ICD-10-CM

## 2024-03-30 NOTE — Progress Notes (Signed)
 Subjective:    Patient ID: Tim Foley, male    DOB: [birth year], 33 y.o.   MRN: [id number]  HPI  33 year old male who  has a past medical history of Hypertension.  He presents to the office today for TCM visit  Admit Date 03/22/2024 Discharge Date 03/24/2024  Presented to the emergency room due to witnessed seizure at work and had another seizure while in the ER.  Patient drinks heavily and drinks about 6-7 shots of liquor per day.  In the ER he received a loading dose of Keppra and 2 mg of IV Ativan for breakthrough seizures.  He was admitted for seizure evaluation and alcohol withdrawal.  His EEG and MRI were unremarkable.  Was presumed seizure secondary to alcohol withdrawal.  He was not started on any antiepileptics.  Alcohol abuse with concern for alcohol withdrawal  -No withdrawal symptoms throughout hospital admission  Alcoholic hepatitis/abnormal LFTs -LFTs  improving upon discharge  Hyperbilirubinemia -In the setting of alcohol use.  Bili went from 1.9-2.6.  Advise repeat CMP at PCP visit in 1 week  Essential Hypertension  -He was supposed to be on amlodipine 5 mg daily but was not on any medication prior to coming to the hospital.  His diastolic blood pressure was elevated and he was discharged home on amlodipine 5 mg daily BP Readings from Last 3 Encounters:  03/30/24 138/70  03/24/24 (!) 142/91  02/13/24 (!) 152/94   Hyperlipidemia -Atorvastatin 20 mg daily was held due to abnormal LFTs.  Advise resume when his LFTs have normalized  Hyperglycemia -Thought to be likely reactive.  Microcytic Anemia  - Hemoglobin stable.   Today he reports that he has not had any further seizure like activity. He has cut back on alcohol consumption ( had two shots over the weekend) and has been hydrating more and eating healthier. He reports feeling good overall and is ready to return to work   Review of Systems  Constitutional: Negative.   HENT: Negative.    Eyes: Negative.    Respiratory: Negative.    Cardiovascular: Negative.   Gastrointestinal: Negative.   Endocrine: Negative.   Genitourinary: Negative.   Musculoskeletal: Negative.   Skin: Negative.   Allergic/Immunologic: Negative.   Neurological: Negative.   Hematological: Negative.   Psychiatric/Behavioral: Negative.    All other systems reviewed and are negative.  Past Medical History:  Diagnosis Date   Hypertension     Social History   Socioeconomic History   Marital status: Single    Spouse name: Not on file   Number of children: Not on file   Years of education: Not on file   Highest education level: Not on file  Occupational History   Not on file  Tobacco Use   Smoking status: Never   Smokeless tobacco: Never  Vaping Use   Vaping status: Never Used  Substance and Sexual Activity   Alcohol use: Yes    Alcohol/week: 4.0 standard drinks of alcohol    Types: 1 Glasses of wine, 1 Cans of beer, 2 Shots of liquor per week    Comment: occasionlly   Drug use: No   Sexual activity: Yes    Partners: Female    Birth control/protection: None  Other Topics Concern   Not on file  Social History Narrative   He works at [employer] and gamble    Social Drivers of Corporate investment banker Strain: Not on file  Food Insecurity: No Food Insecurity (03/22/2024)  Hunger Vital Sign    Worried About Running Out of Food in the Last Year: Never true    Ran Out of Food in the Last Year: Never true  Transportation Needs: No Transportation Needs (03/22/2024)   PRAPARE - Administrator, Civil Service (Medical): No    Lack of Transportation (Non-Medical): No  Physical Activity: Not on file  Stress: Not on file  Social Connections: Not on file  Intimate Partner Violence: Not At Risk (03/22/2024)   Humiliation, Afraid, Rape, and Kick questionnaire    Fear of Current or Ex-Partner: No    Emotionally Abused: No    Physically Abused: No    Sexually Abused: No    History reviewed. No  pertinent surgical history.  Family History  Problem Relation Age of Onset   High blood pressure Mother    Heart attack Father    Colon cancer Maternal Grandmother    High blood pressure Maternal Grandmother    Diabetes Maternal Grandmother    Cancer - Colon Maternal Grandmother     No Known Allergies  Current Outpatient Medications on File Prior to Visit  Medication Sig Dispense Refill   amLODipine (NORVASC) 5 MG tablet Take 1 tablet (5 mg total) by mouth daily. 30 tablet 0   [Paused] atorvastatin (LIPITOR) 20 MG tablet Take 1 tablet (20 mg total) by mouth daily. (Patient not taking: Reported on 03/30/2024) 90 tablet 0   diphenhydrAMINE (BENADRYL) 25 MG tablet Take 1 tablet (25 mg total) by mouth every 6 (six) hours. (Patient not taking: Reported on 03/30/2024) 20 tablet 0   EPINEPHrine 0.3 mg/0.3 mL IJ SOAJ injection Inject 0.3 mg into the muscle as needed for anaphylaxis. (Patient not taking: Reported on 03/30/2024) 1 each 1   No current facility-administered medications on file prior to visit.    BP 138/70   Pulse (!) 113   Temp 98.4 F (36.9 C) (Oral)   Ht 6\' 2"  (1.88 m)   Wt 183 lb (83 kg)   SpO2 97%   BMI 23.50 kg/m       Objective:   Physical Exam Vitals and nursing note reviewed.  Constitutional:      Appearance: Normal appearance.  Cardiovascular:     Rate and Rhythm: Normal rate and regular rhythm.     Pulses: Normal pulses.     Heart sounds: Normal heart sounds.  Pulmonary:     Effort: Pulmonary effort is normal.     Breath sounds: Normal breath sounds.  Skin:    General: Skin is warm and dry.  Neurological:     General: No focal deficit present.     Mental Status: He is alert and oriented to person, place, and time.  Psychiatric:        Mood and Affect: Mood normal.        Behavior: Behavior normal.        Thought Content: Thought content normal.        Judgment: Judgment normal.         Assessment & Plan:  1. Alcohol withdrawal seizure without  complication (HCC) (Primary) - Reviewed hospital notes, discharge instructions, labs, imaging and medication changes All questions answered to the best of my ability  - He is able to return to work and driving  - Refrain from alcohol  - Comprehensive metabolic panel with GFR; Future - CBC; Future  2. Elevated LFTs  - Comprehensive metabolic panel with GFR; Future - CBC; Future  3. Hyperbilirubinemia  -  Comprehensive metabolic panel with GFR; Future - CBC; Future  4. Essential hypertension - BP improving. Continue with Norvasc 5 mg  - Comprehensive metabolic panel with GFR; Future - CBC; Future  5. Mixed hyperlipidemia - Continue to hold statin until labs have come back  - Comprehensive metabolic panel with GFR; Future - CBC; Future  6. Hyperglycemia  - Comprehensive metabolic panel with GFR; Future - CBC; Future  7. Microcytic anemia  - Comprehensive metabolic panel with GFR; Future - CBC; Future  Shirline Frees, NP

## 2024-03-30 NOTE — Patient Instructions (Addendum)
 It was great seeing you today and I am glad you are feeling better.   I am ok with you returning to your normal activities including exercise and driving

## 2024-03-30 NOTE — Transitions of Care (Post Inpatient/ED Visit) (Signed)
   03/30/2024  Name: Tim Foley MRN: [id number] DOB: 11-08-1991  Today's TOC FU Call Status: Today's TOC FU Call Status:: Successful TOC FU Call Completed Unsuccessful Call (1st Attempt) Date: 03/25/24 Unsuccessful Call (2nd Attempt) Date: 03/26/24 [facility] FU Call Complete Date: 03/30/24 Patient's Name and Date of Birth confirmed.  Transition Care Management Follow-up Telephone Call Date of Discharge: 02/25/24 Discharge Facility: [facility]) Type of Discharge: Inpatient Admission Primary Inpatient Discharge Diagnosis:: alcohol use How have you been since you were released from the hospital?: Better Any questions or concerns?: No  Items Reviewed: Did you receive and understand the discharge instructions provided?: Yes Medications obtained,verified, and reconciled?: Yes (Medications Reviewed) Any new allergies since your discharge?: No Dietary orders reviewed?: Yes Do you have support at home?: No  Medications Reviewed Today: Medications Reviewed Today     Reviewed by Karena Addison, LPN (Licensed Practical Nurse) on 03/30/24 at 1147  Med List Status: <None>   Medication Order Taking? Sig Documenting Provider Last Dose Status Informant  amLODipine (NORVASC) 5 MG tablet [id number]  Take 1 tablet (5 mg total) by mouth daily. Hughie Closs, MD  Active   atorvastatin (LIPITOR) 20 MG tablet [id number] No Take 1 tablet (20 mg total) by mouth daily. Shirline Frees, NP 03/21/2024 Active Self, Family Member, Pharmacy Records  diphenhydrAMINE (BENADRYL) 25 MG tablet [id number]  Take 1 tablet (25 mg total) by mouth every 6 (six) hours. Sabas Sous, MD  Active Self, Family Member, Pharmacy Records           Med Note [facility], SEBASTIAN   Mon Mar 22, 2024  7:38 PM) PRN, no recent need/use  EPINEPHrine 0.3 mg/0.3 mL IJ SOAJ injection [id number]  Inject 0.3 mg into the muscle as needed for anaphylaxis. Sabas Sous, MD  Active Self, Family Member, Pharmacy Records           Med Note [facility],  SEBASTIAN   Mon Mar 22, 2024  7:38 PM) PRN, no recent need/use            Home Care and Equipment/Supplies: Were Home Health Services Ordered?: NA Any new equipment or medical supplies ordered?: NA  Functional Questionnaire: Do you need assistance with bathing/showering or dressing?: No Do you need assistance with meal preparation?: No Do you need assistance with eating?: No Do you have difficulty maintaining continence: No Do you need assistance with getting out of bed/getting out of a chair/moving?: No Do you have difficulty managing or taking your medications?: No  Follow up appointments reviewed: PCP Follow-up appointment confirmed?: Yes Date of PCP follow-up appointment?: 03/30/24 Follow-up Provider: [facility] Follow-up appointment confirmed?: NA Do you need transportation to your follow-up appointment?: No Do you understand care options if your condition(s) worsen?: Yes-patient verbalized understanding    SIGNATURE Karena Addison, LPN [facility] Nurse Health Advisor Direct Dial [phone]

## 2024-05-06 ENCOUNTER — Other Ambulatory Visit: Payer: Self-pay | Admitting: Adult Health

## 2024-05-06 MED ORDER — AMLODIPINE BESYLATE 5 MG PO TABS: 5.0000 mg | ORAL_TABLET | Freq: Every day | ORAL | 3 refills | Status: AC

## 2024-05-06 NOTE — Telephone Encounter (Signed)
 Okay for refill?   Pt est. Care with you

## 2024-05-06 NOTE — Telephone Encounter (Signed)
 Copied from CRM [id number]. Topic: Clinical - Medication Refill >> May 06, 2024  1:55 PM Zipporah Him wrote: Medication: amLODipine  (NORVASC ) 5 MG tablet  Has the patient contacted their pharmacy? No (Agent: If no, request that the patient contact the pharmacy for the refill. If patient does not wish to contact the pharmacy document the reason why and proceed with request.) (Agent: If yes, when and what did the pharmacy advise?)  This is the patient's preferred pharmacy:  [id number] Jonette Nestle, [place] - [address] AT [facility] [address] [facility] [address] [place] [address] Phone: [phone] Fax: [phone]   Is this the correct pharmacy for this prescription? Yes If no, delete pharmacy and type the correct one.   Has the prescription been filled recently? No  Is the patient out of the medication? Yes  Has the patient been seen for an appointment in the last year OR does the patient have an upcoming appointment? Yes  Can we respond through MyChart? No  Agent: Please be advised that Rx refills may take up to 3 business days. We ask that you follow-up with your pharmacy.

## 2024-05-12 ENCOUNTER — Ambulatory Visit (INDEPENDENT_AMBULATORY_CARE_PROVIDER_SITE_OTHER): Payer: Self-pay | Admitting: Adult Health

## 2024-05-12 VITALS — BP 140/80 | HR 97 | Temp 98.4°F | Ht 74.0 in | Wt 189.0 lb

## 2024-05-12 DIAGNOSIS — D509 Iron deficiency anemia, unspecified: Secondary | ICD-10-CM

## 2024-05-12 DIAGNOSIS — R7989 Other specified abnormal findings of blood chemistry: Secondary | ICD-10-CM

## 2024-05-12 DIAGNOSIS — F419 Anxiety disorder, unspecified: Secondary | ICD-10-CM | POA: Diagnosis not present

## 2024-05-12 DIAGNOSIS — I10 Essential (primary) hypertension: Secondary | ICD-10-CM | POA: Diagnosis not present

## 2024-05-12 DIAGNOSIS — R569 Unspecified convulsions: Secondary | ICD-10-CM | POA: Diagnosis not present

## 2024-05-12 DIAGNOSIS — E782 Mixed hyperlipidemia: Secondary | ICD-10-CM | POA: Diagnosis not present

## 2024-05-12 DIAGNOSIS — F10930 Alcohol use, unspecified with withdrawal, uncomplicated: Secondary | ICD-10-CM

## 2024-05-12 DIAGNOSIS — R739 Hyperglycemia, unspecified: Secondary | ICD-10-CM

## 2024-05-12 LAB — COMPREHENSIVE METABOLIC PANEL WITH GFR
ALT: 101 U/L — ABNORMAL HIGH (ref 0–53)
AST: 146 U/L — ABNORMAL HIGH (ref 0–37)
Albumin: 5 g/dL (ref 3.5–5.2)
Alkaline Phosphatase: 52 U/L (ref 39–117)
BUN: 10 mg/dL (ref 6–23)
CO2: 27 meq/L (ref 19–32)
Calcium: 9.5 mg/dL (ref 8.4–10.5)
Chloride: 94 meq/L — ABNORMAL LOW (ref 96–112)
Creatinine, Ser: 0.84 mg/dL (ref 0.40–1.50)
GFR: 115.71 mL/min (ref >60.00)
Glucose, Bld: 88 mg/dL (ref 70–99)
Potassium: 3.5 meq/L (ref 3.5–5.1)
Sodium: 136 meq/L (ref 135–145)
Total Bilirubin: 1.1 mg/dL (ref 0.2–1.2)
Total Protein: 8.2 g/dL (ref 6.0–8.3)

## 2024-05-12 LAB — CBC
HCT: 38.1 % — ABNORMAL LOW (ref 39.0–52.0)
Hemoglobin: 12.2 g/dL — ABNORMAL LOW (ref 13.0–17.0)
MCHC: 31.9 g/dL (ref 30.0–36.0)
MCV: 76.1 fl — ABNORMAL LOW (ref 78.0–100.0)
Platelets: 277 10*3/uL (ref 150.0–400.0)
RBC: 5.01 Mil/uL (ref 4.22–5.81)
RDW: 16 % — ABNORMAL HIGH (ref 11.5–15.5)
WBC: 5.2 10*3/uL (ref 4.0–10.5)

## 2024-05-12 NOTE — Patient Instructions (Signed)
 Lets follow up in 06/11/2024 to see how you are doing   If you change your mind about medication or to talk to someone, please let me know  Take your blood pressure medication every night before bed

## 2024-05-12 NOTE — Progress Notes (Signed)
 Subjective:    Patient ID: Tim Foley, male    DOB: 1991/02/06, 33 y.o.   MRN: [id number]  HPI 33 year old male who  has a past medical history of Hypertension.  He presents to the office today for follow up.  When he was last seen about 6 weeks ago was for hospital follow-up after a witnessed seizure at work.  It was thought that his seizure was due to alcohol withdrawal.  His EEG and MRI were unremarkable.  He was not started on any antiepileptics.  He has been on "light duty" at work and has a form that needs to be filled out.  He still feels nervous at times while at work that he is afraid he is going to have another seizure; he is unsure if this is anxiety.  Would like to continue on light duty for another few weeks.   He does report that he has cut back significantly on his alcohol consumption.  He has not had any seizure-like activity since the hospital admission.   Was noted that his blood pressure was elevated in the office today at 160/100.  He reports that he took his Norvasc  around 1:00 today because he forgot to take it this morning before going to work.  He will often miss his morning dose and will take it in mid afternoon.  Review of Systems See HPI   Past Medical History:  Diagnosis Date   Hypertension     Social History   Socioeconomic History   Marital status: Single    Spouse name: Not on file   Number of children: Not on file   Years of education: Not on file   Highest education level: Not on file  Occupational History   Not on file  Tobacco Use   Smoking status: Never   Smokeless tobacco: Never  Vaping Use   Vaping status: Never Used  Substance and Sexual Activity   Alcohol use: Yes    Alcohol/week: 4.0 standard drinks of alcohol    Types: 1 Glasses of wine, 1 Cans of beer, 2 Shots of liquor per week    Comment: occasionlly   Drug use: No   Sexual activity: Yes    Partners: Female    Birth control/protection: None  Other Topics Concern    Not on file  Social History Narrative   He works at [employer] and gamble    Social Drivers of Corporate investment banker Strain: Not on file  Food Insecurity: No Food Insecurity (03/22/2024)   Hunger Vital Sign    Worried About Running Out of Food in the Last Year: Never true    Ran Out of Food in the Last Year: Never true  Transportation Needs: No Transportation Needs (03/22/2024)   PRAPARE - Administrator, Civil Service (Medical): No    Lack of Transportation (Non-Medical): No  Physical Activity: Not on file  Stress: Not on file  Social Connections: Not on file  Intimate Partner Violence: Not At Risk (03/22/2024)   Humiliation, Afraid, Rape, and Kick questionnaire    Fear of Current or Ex-Partner: No    Emotionally Abused: No    Physically Abused: No    Sexually Abused: No    No past surgical history on file.  Family History  Problem Relation Age of Onset   High blood pressure Mother    Heart attack Father    Colon cancer Maternal Grandmother    High blood pressure  Maternal Grandmother    Diabetes Maternal Grandmother    Cancer - Colon Maternal Grandmother     No Known Allergies  Current Outpatient Medications on File Prior to Visit  Medication Sig Dispense Refill   amLODipine  (NORVASC ) 5 MG tablet Take 1 tablet (5 mg total) by mouth daily. 90 tablet 3   atorvastatin  (LIPITOR) 20 MG tablet Take 1 tablet (20 mg total) by mouth daily. (Patient not taking: Reported on 05/12/2024) 90 tablet 0   No current facility-administered medications on file prior to visit.    BP (!) 140/80   Pulse 97   Temp 98.4 F (36.9 C) (Oral)   Ht 6\' 2"  (1.88 m)   Wt 189 lb (85.7 kg)   SpO2 97%   BMI 24.27 kg/m       Objective:   Physical Exam Vitals and nursing note reviewed.  Constitutional:      Appearance: Normal appearance.  Cardiovascular:     Rate and Rhythm: Normal rate and regular rhythm.     Pulses: Normal pulses.     Heart sounds: Normal heart sounds.   Pulmonary:     Effort: Pulmonary effort is normal.     Breath sounds: Normal breath sounds.  Musculoskeletal:        General: Normal range of motion.  Skin:    General: Skin is warm and dry.  Neurological:     General: No focal deficit present.     Mental Status: He is alert and oriented to person, place, and time.  Psychiatric:        Mood and Affect: Mood is anxious.        Speech: Speech normal.        Behavior: Behavior normal.        Thought Content: Thought content normal.        Cognition and Memory: Cognition and memory normal.        Judgment: Judgment normal.     Comments: Jittery during exam today. He reported feeling " nervous".        Assessment & Plan:  1. Alcohol withdrawal seizure without complication (HCC) (Primary) - Work form filled out.  I will give him another month of light duty and then he is to return to work without restrictions.  Will have him follow-up in 1 month to see how he is doing.  2. Essential hypertension -On recheck before leaving the office today his blood pressure was 140/80.  I will have him start taking his blood pressure medication before bed to get on a better routine.  Follow-up in 1 month  3. Anxiety -Did seem quite anxious in the office today.  Upper extremity trembling noted.  He did not want a referral to psychiatry and did not want to be on medication to help with his anxiety/nervousness at this time.  He was advised to follow-up at any time if he changes his mind. - Follow up in 30 days   Time spent with patient today was 36 minutes which consisted of chart review, discussing seizures, HTN, anxiety, work up, treatment answering questions and documentation.

## 2024-05-13 ENCOUNTER — Other Ambulatory Visit: Payer: Self-pay | Admitting: Adult Health

## 2024-05-13 ENCOUNTER — Ambulatory Visit: Payer: Self-pay | Admitting: Adult Health

## 2024-05-13 DIAGNOSIS — R748 Abnormal levels of other serum enzymes: Secondary | ICD-10-CM

## 2024-05-31 ENCOUNTER — Other Ambulatory Visit: Payer: Self-pay

## 2024-05-31 ENCOUNTER — Encounter (HOSPITAL_COMMUNITY): Payer: Self-pay

## 2024-05-31 ENCOUNTER — Emergency Department (HOSPITAL_COMMUNITY)
Admission: EM | Admit: 2024-05-31 | Discharge: 2024-05-31 | Disposition: A | Discharge status: Home / Self Care | Attending: Emergency Medicine | Admitting: Emergency Medicine

## 2024-05-31 DIAGNOSIS — R569 Unspecified convulsions: Secondary | ICD-10-CM | POA: Diagnosis present

## 2024-05-31 HISTORY — DX: Unspecified convulsions: R56.9

## 2024-05-31 LAB — COMPREHENSIVE METABOLIC PANEL WITH GFR
ALT: 125 U/L — ABNORMAL HIGH (ref 0–44)
AST: 226 U/L — ABNORMAL HIGH (ref 15–41)
Albumin: 4.4 g/dL (ref 3.5–5.0)
Alkaline Phosphatase: 47 U/L (ref 38–126)
Anion gap: 18 — ABNORMAL HIGH (ref 5–15)
BUN: 11 mg/dL (ref 6–20)
CO2: 19 mmol/L — ABNORMAL LOW (ref 22–32)
Calcium: 9.7 mg/dL (ref 8.9–10.3)
Chloride: 96 mmol/L — ABNORMAL LOW (ref 98–111)
Creatinine, Ser: 1.03 mg/dL (ref 0.61–1.24)
GFR, Estimated: >60 mL/min (ref >60)
Glucose, Bld: 143 mg/dL — ABNORMAL HIGH (ref 70–99)
Potassium: 3.3 mmol/L — ABNORMAL LOW (ref 3.5–5.1)
Sodium: 133 mmol/L — ABNORMAL LOW (ref 135–145)
Total Bilirubin: 1.5 mg/dL — ABNORMAL HIGH (ref 0.0–1.2)
Total Protein: 7.4 g/dL (ref 6.5–8.1)

## 2024-05-31 LAB — AMMONIA: Ammonia: 38 umol/L — ABNORMAL HIGH (ref 9–35)

## 2024-05-31 LAB — CBC WITH DIFFERENTIAL/PLATELET
Abs Immature Granulocytes: 0.04 10*3/uL (ref 0.00–0.07)
Basophils Absolute: 0 10*3/uL (ref 0.0–0.1)
Basophils Relative: 0 %
Eosinophils Absolute: 0 10*3/uL (ref 0.0–0.5)
Eosinophils Relative: 0 %
HCT: 35.2 % — ABNORMAL LOW (ref 39.0–52.0)
Hemoglobin: 11.2 g/dL — ABNORMAL LOW (ref 13.0–17.0)
Immature Granulocytes: 1 %
Lymphocytes Relative: 9 %
Lymphs Abs: 0.8 10*3/uL (ref 0.7–4.0)
MCH: 24.4 pg — ABNORMAL LOW (ref 26.0–34.0)
MCHC: 31.8 g/dL (ref 30.0–36.0)
MCV: 76.7 fL — ABNORMAL LOW (ref 80.0–100.0)
Monocytes Absolute: 0.7 10*3/uL (ref 0.1–1.0)
Monocytes Relative: 8 %
Neutro Abs: 6.9 10*3/uL (ref 1.7–7.7)
Neutrophils Relative %: 82 %
Platelets: 210 10*3/uL (ref 150–400)
RBC: 4.59 MIL/uL (ref 4.22–5.81)
RDW: 15.5 % (ref 11.5–15.5)
WBC: 8.5 10*3/uL (ref 4.0–10.5)
nRBC: 0 % (ref 0.0–0.2)

## 2024-05-31 LAB — CBG MONITORING, ED: Glucose-Capillary: 98 mg/dL (ref 70–99)

## 2024-05-31 LAB — ETHANOL: Alcohol, Ethyl (B): <15 mg/dL (ref <15)

## 2024-05-31 MED ORDER — CHLORDIAZEPOXIDE HCL 25 MG PO CAPS
100.0000 mg | ORAL_CAPSULE | Freq: Once | ORAL | Status: AC
  Administered 2024-05-31: 100 mg via ORAL
  Filled 2024-05-31: qty 4

## 2024-05-31 MED ORDER — DIAZEPAM 5 MG/ML IJ SOLN
5.0000 mg | Freq: Once | INTRAMUSCULAR | Status: AC
  Administered 2024-05-31: 5 mg via INTRAVENOUS
  Filled 2024-05-31: qty 2

## 2024-05-31 MED ORDER — LEVETIRACETAM 500 MG PO TABS
1000.0000 mg | ORAL_TABLET | Freq: Once | ORAL | Status: AC
  Administered 2024-05-31: 1000 mg via ORAL
  Filled 2024-05-31: qty 2

## 2024-05-31 MED ORDER — LEVETIRACETAM 500 MG PO TABS
500.0000 mg | ORAL_TABLET | Freq: Two times a day (BID) | ORAL | 0 refills | Status: DC
Start: 2024-05-31 — End: 2024-06-10

## 2024-05-31 MED ORDER — LEVETIRACETAM 500 MG PO TABS: 500.0000 mg | ORAL_TABLET | Freq: Two times a day (BID) | ORAL | 0 refills | Status: DC

## 2024-05-31 NOTE — ED Provider Notes (Signed)
  Physical Exam  BP (!) 147/85   Pulse 96   Resp 18   SpO2 100%   Physical Exam  Procedures  Procedures  ED Course / MDM     Care of patient from Dr. Inga Manges.  History of prior seizure, thought to be due to alcohol withdrawal.  Had a witnessed seizure today, but gives history that does not clearly linked to alcohol withdrawal.  He has not had any tremulousness, hypertension, tachycardia, and it is not clear how much alcohol he is drinking.  Consider the possibility this could be underlying seizure disorder.  He had MRI and CT completed during his last hospitalization which showed no acute abnormalities.  Labs completed and personally 9 interpreted by me and show no acute abnormalities.  No history of head trauma today.  Valium  and Librium were given by Dr. Inga Manges.  He is sleepy at time of my evaluation initially, which may be secondary to medications and postictal state.  On reevaluation, he is improved.  I have not seen clear signs of alcohol withdrawal during my time, and it is not clear that he was having other symptoms to suggest this prior to his seizure or prior to receiving medications in the emergency department.  Discussed this could be on the differential if he had been drinking heavily and stopped.  Offered Librium taper, but he declines.    Given this is his second seizure, not clearly related to alcohol withdrawal by his history and exam, will initiate Keppra .  Gave him a dose in the emergency department and prescription for 500 mg twice daily.  Will refer to [facility] neurology.  Discussed seizure precautions including driving restrictions and reasons to return.     Scarlette Currier, MD 06/01/24 1205

## 2024-05-31 NOTE — ED Provider Notes (Signed)
 [name] Provider Note   CSN: [id number] Arrival date & time: 05/31/24  1514     History  Chief Complaint  Patient presents with   Seizures    Tim Foley is a 33 y.o. male.  33 yo M with a chief complaints of a seizure.  Occurred at work.  Lasted less than a minute.  Patient with a postictal period.  Arrives feeling bit better but not exactly sure what happened.  He denies any injury during the seizure.  Denies anything he thinks might of brought it on.  Not on medication.  Has had 1 before.  Was hospitalized for it.   Seizures      Home Medications Prior to Admission medications   Medication Sig Start Date End Date Taking? Authorizing Provider  amLODipine  (NORVASC ) 5 MG tablet Take 1 tablet (5 mg total) by mouth daily. 05/06/24 05/06/25  Nafziger, Cory, NP  atorvastatin  (LIPITOR) 20 MG tablet Take 1 tablet (20 mg total) by mouth daily. Patient not taking: Reported on 05/12/2024 10/23/23   Nafziger, Cory, NP      Allergies    Patient has no known allergies.    Review of Systems   Review of Systems  Neurological:  Positive for seizures.    Physical Exam Updated Vital Signs BP (!) 147/85   Pulse 96   Resp 18   SpO2 100%  Physical Exam Vitals and nursing note reviewed.  Constitutional:      Appearance: He is well-developed.  HENT:     Head: Normocephalic and atraumatic.  Eyes:     Pupils: Pupils are equal, round, and reactive to light.  Neck:     Vascular: No JVD.  Cardiovascular:     Rate and Rhythm: Normal rate and regular rhythm.     Heart sounds: No murmur heard.    No friction rub. No gallop.  Pulmonary:     Effort: No respiratory distress.     Breath sounds: No wheezing.  Abdominal:     General: There is no distension.     Tenderness: There is no abdominal tenderness. There is no guarding or rebound.  Musculoskeletal:        General: Normal range of motion.     Cervical back: Normal range of  motion and neck supple.  Skin:    Coloration: Skin is not pale.     Findings: No rash.  Neurological:     Mental Status: He is alert and oriented to person, place, and time.  Psychiatric:        Behavior: Behavior normal.     ED Results / Procedures / Treatments   Labs (all labs ordered are listed, but only abnormal results are displayed) Labs Reviewed  CBC WITH DIFFERENTIAL/PLATELET - Abnormal; Notable for the following components:      Result Value   Hemoglobin 11.2 (*)    HCT 35.2 (*)    MCV 76.7 (*)    MCH 24.4 (*)    All other components within normal limits  COMPREHENSIVE METABOLIC PANEL WITH GFR  ETHANOL  CBG MONITORING, ED    EKG EKG Interpretation Date/Time:  Monday May 31 2024 15:20:13 EDT Ventricular Rate:  95 PR Interval:    QRS Duration:  91 QT Interval:  333 QTC Calculation: 419 R Axis:   69  Text Interpretation: Normal sinus rhythm Borderline ST elevation, anterior leads No significant change since last tracing Confirmed by Albertus Hughs [phone]) on 05/31/2024 4:02:25  PM  Radiology No results found.  Procedures Procedures    Medications Ordered in ED Medications  diazepam  (VALIUM ) injection 5 mg (has no administration in time range)  chlordiazePOXIDE (LIBRIUM) capsule 100 mg (has no administration in time range)    ED Course/ Medical Decision Making/ A&P                                 Medical Decision Making Amount and/or Complexity of Data Reviewed Labs: ordered.  Risk Prescription drug management.   42 y oM with a chief complaint of a seizure.  Patient was in the hospital back in March with a similar event.  Thought to be due to withdrawing from alcohol.  I asked the patient about this and he initially told me he has not been drinking and then when his mother was in the room told me he has been drinking but no longer hard alcohol and has been sticking to beer.  Last drink on Saturday.  Will attempt to control his symptoms.  Check blood  work.  Reassess.  The patients results and plan were reviewed and discussed.   Any x-rays performed were independently reviewed by myself.   Differential diagnosis were considered with the presenting HPI.  Medications  diazepam  (VALIUM ) injection 5 mg (has no administration in time range)  chlordiazePOXIDE (LIBRIUM) capsule 100 mg (has no administration in time range)    Vitals:   05/31/24 1520  BP: (!) 147/85  Pulse: 96  Resp: 18  SpO2: 100%    Final diagnoses:  Seizure [facility])           Final Clinical Impression(s) / ED Diagnoses Final diagnoses:  Seizure [facility])    Rx / DC Orders ED Discharge Orders     None         Albertus Hughs, DO 05/31/24 1620

## 2024-05-31 NOTE — ED Triage Notes (Signed)
 [facility] reports pt was at work and had a grand mal seizure lasting about 1 min per coworkers, no incontinence, oral trauma noted. Pt recently diagnosed with seizures about 1 month ago but not on any meds for it. EMS reports pt did fall and hit his head. Pt not c/o any pain.

## 2024-06-10 ENCOUNTER — Encounter: Payer: Self-pay | Admitting: Adult Health

## 2024-06-10 ENCOUNTER — Ambulatory Visit: Admitting: Adult Health

## 2024-06-10 VITALS — BP 130/86 | HR 81 | Temp 98.3°F | Wt 181.6 lb

## 2024-06-10 DIAGNOSIS — R748 Abnormal levels of other serum enzymes: Secondary | ICD-10-CM | POA: Diagnosis not present

## 2024-06-10 DIAGNOSIS — R569 Unspecified convulsions: Secondary | ICD-10-CM

## 2024-06-10 MED ORDER — LEVETIRACETAM 500 MG PO TABS
500.0000 mg | ORAL_TABLET | Freq: Two times a day (BID) | ORAL | 0 refills | Status: DC
Start: 2024-06-10 — End: 2024-07-29

## 2024-06-10 NOTE — Patient Instructions (Addendum)
 It was great seeing you today   I am going to recheck some blood work   Please follow up with Neurology   Call to schedule your ultrasound:  [phone].   No driving for 6 months

## 2024-06-10 NOTE — Progress Notes (Signed)
 Subjective:    Patient ID: Tim Foley, male    DOB: 09/18/1991, 33 y.o.   MRN: [id number]  HPI 33 year old male who  has a past medical history of Hypertension and Seizures (HCC).  He presents to the office today for follow up   He was seen in the emergency room 10 days ago for seizure like activity that occurred at work.  Seizure lasted less than a minute and patient was noted to have a postictal period.  Had a seizure prior on 03/22/2024 and was hospitalized during this time period thought to be due to alcohol withdrawal.  This seizure was not clearly linked to alcohol withdrawal as he did not have any tremulousness, hypertension, tachycardia.  He did have an MRI and CT completed during his last hospitalization which showed no acute abnormalities.  Labs during this ER visit showed no acute abnormalities  Started on Keppra  since this was his second seizure and not clearly related to alcohol withdrawal.  He was also referred to [facility].   Today in the office he reports that he has not had any further seizures and is taking his Keppra  BID. He is nervous about having another one especially while at work since this is where he has had his two prior seizure-like episodes.  He does have an appointment with neurology on July 29, 2024.  He does have a pending ultrasound of his liver, he was advised in the hospital not to take his Lipitor as his AST and ALT had increased  He needs FMLA paperwork filled out today   Review of Systems See HPI   Past Medical History:  Diagnosis Date   Hypertension    Seizures (HCC)     Social History   Socioeconomic History   Marital status: Single    Spouse name: Not on file   Number of children: Not on file   Years of education: Not on file   Highest education level: Not on file  Occupational History   Not on file  Tobacco Use   Smoking status: Never   Smokeless tobacco: Never  Vaping Use   Vaping status: Never Used  Substance and  Sexual Activity   Alcohol use: Yes    Alcohol/week: 4.0 standard drinks of alcohol    Types: 1 Glasses of wine, 1 Cans of beer, 2 Shots of liquor per week    Comment: occasionlly   Drug use: No   Sexual activity: Yes    Partners: Female    Birth control/protection: None  Other Topics Concern   Not on file  Social History Narrative   He works at [employer] and gamble    Social Drivers of Corporate investment banker Strain: Not on file  Food Insecurity: No Food Insecurity (03/22/2024)   Hunger Vital Sign    Worried About Running Out of Food in the Last Year: Never true    Ran Out of Food in the Last Year: Never true  Transportation Needs: No Transportation Needs (03/22/2024)   PRAPARE - Administrator, Civil Service (Medical): No    Lack of Transportation (Non-Medical): No  Physical Activity: Not on file  Stress: Not on file  Social Connections: Not on file  Intimate Partner Violence: Not At Risk (03/22/2024)   Humiliation, Afraid, Rape, and Kick questionnaire    Fear of Current or Ex-Partner: No    Emotionally Abused: No    Physically Abused: No    Sexually Abused: No  History reviewed. No pertinent surgical history.  Family History  Problem Relation Age of Onset   High blood pressure Mother    Heart attack Father    Colon cancer Maternal Grandmother    High blood pressure Maternal Grandmother    Diabetes Maternal Grandmother    Cancer - Colon Maternal Grandmother     No Known Allergies  Current Outpatient Medications on File Prior to Visit  Medication Sig Dispense Refill   amLODipine  (NORVASC ) 5 MG tablet Take 1 tablet (5 mg total) by mouth daily. 90 tablet 3   levETIRAcetam  (KEPPRA ) 500 MG tablet Take 1 tablet (500 mg total) by mouth 2 (two) times daily. 60 tablet 0   atorvastatin  (LIPITOR) 20 MG tablet Take 1 tablet (20 mg total) by mouth daily. (Patient not taking: Reported on 06/10/2024) 90 tablet 0   No current facility-administered medications on  file prior to visit.    BP 130/86 (BP Location: Left Arm, Patient Position: Sitting, Cuff Size: Normal)   Pulse 81   Temp 98.3 F (36.8 C) (Oral)   Wt 181 lb 9.6 oz (82.4 kg)   SpO2 98%   BMI 23.32 kg/m       Objective:   Physical Exam Vitals and nursing note reviewed.  Constitutional:      Appearance: Normal appearance.   Cardiovascular:     Rate and Rhythm: Normal rate and regular rhythm.     Pulses: Normal pulses.     Heart sounds: Normal heart sounds.  Pulmonary:     Effort: Pulmonary effort is normal.     Breath sounds: Normal breath sounds.   Skin:    General: Skin is warm and dry.   Neurological:     General: No focal deficit present.     Mental Status: He is alert and oriented to person, place, and time.   Psychiatric:        Mood and Affect: Mood is anxious.        Behavior: Behavior normal.        Thought Content: Thought content normal.        Judgment: Judgment normal.        Assessment & Plan:  1. Seizure-like activity (HCC) (Primary) - Reviewed hospital notes and medication changes.  I will print off another prescription for him to have his he will run out of medication prior to being seen by neurology.  He was advised no driving for 6 months.  We reviewed his FMLA paperwork and filled this out together.  Advised follow-up if needed especially if he continues to have seizure-like activity - levETIRAcetam  (KEPPRA ) 500 MG tablet; Take 1 tablet (500 mg total) by mouth 2 (two) times daily.  Dispense: 60 tablet; Refill: 0  2. Elevated liver enzymes - He will call and schedule his RUQ US   - Consider referral to GI  - Comprehensive metabolic panel with GFR; Future - Comprehensive metabolic panel with GFR  Alto Atta, NP  Time spent with patient today was 45 minutes which consisted of chart review, discussing Seizures, elevated liver enzymes and filling out FMLA paperwork, work up, treatment answering questions and documentation.

## 2024-06-11 ENCOUNTER — Ambulatory Visit: Payer: Self-pay | Admitting: Adult Health

## 2024-06-11 ENCOUNTER — Telehealth: Payer: Self-pay | Admitting: Adult Health

## 2024-06-11 LAB — COMPREHENSIVE METABOLIC PANEL WITH GFR
ALT: 189 U/L — ABNORMAL HIGH (ref 0–53)
AST: 196 U/L — ABNORMAL HIGH (ref 0–37)
Albumin: 4.8 g/dL (ref 3.5–5.2)
Alkaline Phosphatase: 47 U/L (ref 39–117)
BUN: 11 mg/dL (ref 6–23)
CO2: 28 meq/L (ref 19–32)
Calcium: 9.5 mg/dL (ref 8.4–10.5)
Chloride: 99 meq/L (ref 96–112)
Creatinine, Ser: 1.04 mg/dL (ref 0.40–1.50)
GFR: 95.22 mL/min (ref >60.00)
Glucose, Bld: 79 mg/dL (ref 70–99)
Potassium: 4.2 meq/L (ref 3.5–5.1)
Sodium: 138 meq/L (ref 135–145)
Total Bilirubin: 0.7 mg/dL (ref 0.2–1.2)
Total Protein: 8.1 g/dL (ref 6.0–8.3)

## 2024-06-11 NOTE — Telephone Encounter (Signed)
 Copied from CRM [id number]. Topic: Referral - Question >> Jun 11, 2024  2:49 PM Dewanda Foots wrote: Reason for CRM: Tim Foley states that the neurologist we sent the referral over to was not in network with his insurance. He called the insurance and would like to go to one that is in network please.  He would like a recommendation for a referral for a neurologist that is in network.  Please call him back with updates on this Pt callback number is [phone]

## 2024-06-14 NOTE — Telephone Encounter (Signed)
 Will send note to our referral coordinator. Can you help with this please?

## 2024-06-15 ENCOUNTER — Other Ambulatory Visit

## 2024-06-15 ENCOUNTER — Telehealth: Payer: Self-pay | Admitting: Adult Health

## 2024-06-15 DIAGNOSIS — F10930 Alcohol use, unspecified with withdrawal, uncomplicated: Secondary | ICD-10-CM

## 2024-06-15 NOTE — Telephone Encounter (Signed)
 Noted. Tried to call pt for previous message but no answer.

## 2024-06-15 NOTE — Telephone Encounter (Signed)
 Patient would like a call back to further discuss neurologist referral [phone]

## 2024-06-15 NOTE — Telephone Encounter (Signed)
 Patients mother dropped off paperwork with list of in network providers for ultrasound. Paperwork in folder

## 2024-06-16 ENCOUNTER — Other Ambulatory Visit: Payer: Self-pay | Admitting: Adult Health

## 2024-06-16 DIAGNOSIS — R748 Abnormal levels of other serum enzymes: Secondary | ICD-10-CM

## 2024-06-16 NOTE — Telephone Encounter (Signed)
 Please see other phone note

## 2024-06-16 NOTE — Telephone Encounter (Signed)
 Called pt multiple times to advised of note below. Will try again tomorrow.

## 2024-06-16 NOTE — Telephone Encounter (Unsigned)
 Copied from CRM [id number]. Topic: General - Other >> Jun 15, 2024  5:04 PM Tim Foley wrote: Reason for CRM: Patient is calling Harry Shuck back, CAL was closed when the patient called back. Advised patient I will send a message for a call back tomorrow.

## 2024-06-23 ENCOUNTER — Other Ambulatory Visit: Payer: Self-pay | Admitting: Adult Health

## 2024-06-23 DIAGNOSIS — R748 Abnormal levels of other serum enzymes: Secondary | ICD-10-CM

## 2024-06-23 NOTE — Addendum Note (Signed)
 Addended by: VICCI LEADER R on: 06/23/2024 07:37 AM   Modules accepted: Orders

## 2024-06-30 NOTE — Addendum Note (Signed)
 Addended by: VICCI LEADER R on: 06/30/2024 08:50 AM   Modules accepted: Orders

## 2024-07-05 ENCOUNTER — Ambulatory Visit

## 2024-07-05 DIAGNOSIS — R748 Abnormal levels of other serum enzymes: Secondary | ICD-10-CM

## 2024-07-06 ENCOUNTER — Ambulatory Visit: Payer: Self-pay | Admitting: Adult Health

## 2024-07-29 ENCOUNTER — Ambulatory Visit: Admitting: Diagnostic Neuroimaging

## 2024-07-29 ENCOUNTER — Other Ambulatory Visit: Payer: Self-pay | Admitting: Adult Health

## 2024-07-29 ENCOUNTER — Encounter: Payer: Self-pay | Admitting: Diagnostic Neuroimaging

## 2024-07-29 VITALS — BP 144/89 | HR 96 | Ht 74.0 in | Wt 176.0 lb

## 2024-07-29 DIAGNOSIS — F1090 Alcohol use, unspecified, uncomplicated: Secondary | ICD-10-CM

## 2024-07-29 DIAGNOSIS — R569 Unspecified convulsions: Secondary | ICD-10-CM

## 2024-07-29 DIAGNOSIS — G40909 Epilepsy, unspecified, not intractable, without status epilepticus: Secondary | ICD-10-CM | POA: Diagnosis not present

## 2024-07-29 MED ORDER — LEVETIRACETAM 500 MG PO TABS: 500.0000 mg | ORAL_TABLET | Freq: Two times a day (BID) | ORAL | 0 refills | Status: DC

## 2024-07-29 MED ORDER — LEVETIRACETAM 500 MG PO TABS: 500.0000 mg | ORAL_TABLET | Freq: Two times a day (BID) | ORAL | 4 refills | Status: DC

## 2024-07-29 NOTE — Patient Instructions (Addendum)
  SEIZURE DISORDER (last seizure 05/31/24)  - continue levetiracetam  500mg  twice a day   - caution with alcohol use disorder; consider PCP or psychiatry follow up  - According to [place] law, you can not drive unless you are seizure / syncope free for at least 6 months and under physician's care.   - Please maintain precautions. Do not participate in activities where a loss of awareness could harm you or someone else. No swimming alone, no tub bathing, no hot tubs, no driving, no operating motorized vehicles (cars, ATVs, motocycles, etc), lawnmowers, power tools or firearms. No standing at heights, such as rooftops, ladders or stairs. Avoid hot objects such as stoves, heaters, open fires. Wear a helmet when riding a bicycle, scooter, skateboard, etc. and avoid areas of traffic. Set your water heater to 120 degrees or less.

## 2024-07-29 NOTE — Progress Notes (Addendum)
 [facility]  PATIENT: Tim Foley DOB: 1991/08/14  REFERRING CLINICIAN: Dreama Longs, MD HISTORY FROM: patient and mother REASON FOR VISIT: new consult   HISTORICAL  CHIEF COMPLAINT:  Chief Complaint  Patient presents with   Seizures    RM 7 with mother Pt is well, reports first sz was in March. Last sz was June 3rd, per mother.  Pt states he blacks out and wakes up and doesn't recall what happened. Denies any sz activity since June.      HISTORY OF PRESENT ILLNESS:   33 year old male here for evaluation of seizures.  03/22/2024 patient was admitted to the hospital after witnessed seizure at work.  He had a second seizure after arriving in the emergency room.  He was admitted for seizure evaluation and possible alcohol withdrawal.  He was stabilized and discharged on alcohol withdrawal precautions and advised to stop drinking alcohol.  05/31/2024 patient had another seizure at work.  However patient states that he had significant cut down drinking alcohol.  No alcohol withdrawal symptoms upon arrival to emergency room.  Therefore patient was started on levetiracetam  500mg  twice a day and recommended to follow-up with neurology.  Since that time patient tolerating medications.  He still drinks alcohol but has cut down.  He is not that interested in discussing this topic today in this visit.    REVIEW OF SYSTEMS: Full 14 system review of systems performed and negative with exception of: as per HPI.  ALLERGIES: No Known Allergies  HOME MEDICATIONS: Outpatient Medications Prior to Visit  Medication Sig Dispense Refill   amLODipine  (NORVASC ) 5 MG tablet Take 1 tablet (5 mg total) by mouth daily. 90 tablet 3   levETIRAcetam  (KEPPRA ) 500 MG tablet Take 1 tablet (500 mg total) by mouth 2 (two) times daily. 60 tablet 0   atorvastatin  (LIPITOR) 20 MG tablet Take 1 tablet (20 mg total) by mouth daily. (Patient not taking: Reported on 06/10/2024) 90 tablet 0    No facility-administered medications prior to visit.    PAST MEDICAL HISTORY: Past Medical History:  Diagnosis Date   Hypertension    Seizures (HCC)     PAST SURGICAL HISTORY: History reviewed. No pertinent surgical history.  FAMILY HISTORY: Family History  Problem Relation Age of Onset   High blood pressure Mother    Heart attack Father    Colon cancer Maternal Grandmother    High blood pressure Maternal Grandmother    Diabetes Maternal Grandmother    Cancer - Colon Maternal Grandmother     SOCIAL HISTORY: Social History   Socioeconomic History   Marital status: Single    Spouse name: Not on file   Number of children: Not on file   Years of education: Not on file   Highest education level: Not on file  Occupational History   Not on file  Tobacco Use   Smoking status: Never   Smokeless tobacco: Never  Vaping Use   Vaping status: Never Used  Substance and Sexual Activity   Alcohol use: Yes    Alcohol/week: 4.0 standard drinks of alcohol    Types: 1 Glasses of wine, 1 Cans of beer, 2 Shots of liquor per week    Comment: occasionlly   Drug use: No   Sexual activity: Yes    Partners: Female    Birth control/protection: None  Other Topics Concern   Not on file  Social History Narrative   He works at [employer] and [employer]  Social Drivers of Corporate investment banker Strain: Not on file  Food Insecurity: No Food Insecurity (03/22/2024)   Hunger Vital Sign    Worried About Running Out of Food in the Last Year: Never true    Ran Out of Food in the Last Year: Never true  Transportation Needs: No Transportation Needs (03/22/2024)   PRAPARE - Administrator, Civil Service (Medical): No    Lack of Transportation (Non-Medical): No  Physical Activity: Not on file  Stress: Not on file  Social Connections: Not on file  Intimate Partner Violence: Not At Risk (03/22/2024)   Humiliation, Afraid, Rape, and Kick questionnaire    Fear of Current or  Ex-Partner: No    Emotionally Abused: No    Physically Abused: No    Sexually Abused: No     PHYSICAL EXAM  GENERAL EXAM/CONSTITUTIONAL: Vitals:  Vitals:   07/29/24 1607  BP: (!) 144/89  Pulse: 96  Weight: 176 lb (79.8 kg)  Height: 6' 2 (1.88 m)   Body mass index is 22.6 kg/m. Wt Readings from Last 3 Encounters:  07/29/24 176 lb (79.8 kg)  06/10/24 181 lb 9.6 oz (82.4 kg)  05/12/24 189 lb (85.7 kg)   Patient is in no distress; well developed, nourished and groomed; neck is supple  CARDIOVASCULAR: Examination of carotid arteries is normal; no carotid bruits Regular rate and rhythm, no murmurs Examination of peripheral vascular system by observation and palpation is normal  EYES: Ophthalmoscopic exam of optic discs and posterior segments is normal; no papilledema or hemorrhages No results found.  MUSCULOSKELETAL: Gait, strength, tone, movements noted in Neurologic exam below  NEUROLOGIC: MENTAL STATUS:      No data to display         awake, alert, oriented to person, place and time recent and remote memory intact normal attention and concentration language fluent, comprehension intact, naming intact fund of knowledge appropriate  CRANIAL NERVE:  2nd - no papilledema on fundoscopic exam 2nd, 3rd, 4th, 6th - pupils equal and reactive to light, visual fields full to confrontation, extraocular muscles intact, no nystagmus 5th - facial sensation symmetric 7th - facial strength symmetric 8th - hearing intact 9th - palate elevates symmetrically, uvula midline 11th - shoulder shrug symmetric 12th - tongue protrusion midline  MOTOR:  normal bulk and tone, full strength in the BUE, BLE  SENSORY:  normal and symmetric to light touch, temperature, vibration  COORDINATION:  finger-nose-finger, fine finger movements normal  REFLEXES:  deep tendon reflexes present and symmetric  GAIT/STATION:  narrow based gait     DIAGNOSTIC DATA (LABS, IMAGING,  TESTING) - I reviewed patient records, labs, notes, testing and imaging myself where available.  Lab Results  Component Value Date   WBC 8.5 05/31/2024   HGB 11.2 (L) 05/31/2024   HCT 35.2 (L) 05/31/2024   MCV 76.7 (L) 05/31/2024   PLT 210 05/31/2024      Component Value Date/Time   NA 138 06/10/2024 1512   K 4.2 06/10/2024 1512   CL 99 06/10/2024 1512   CO2 28 06/10/2024 1512   GLUCOSE 79 06/10/2024 1512   BUN 11 06/10/2024 1512   CREATININE 1.04 06/10/2024 1512   CALCIUM  9.5 06/10/2024 1512   PROT 8.1 06/10/2024 1512   ALBUMIN 4.8 06/10/2024 1512   AST 196 (H) 06/10/2024 1512   ALT 189 (H) 06/10/2024 1512   ALKPHOS 47 06/10/2024 1512   BILITOT 0.7 06/10/2024 1512   GFRNONAA >60 05/31/2024 1552  GFRAA >60 07/10/2020 1755   Lab Results  Component Value Date   CHOL 350 (H) 10/21/2023   HDL 92.90 10/21/2023   LDLCALC 229 (H) 10/21/2023   TRIG 141.0 10/21/2023   CHOLHDL 4 10/21/2023   Lab Results  Component Value Date   HGBA1C 4.9 10/21/2023   No results found for: [id number] Lab Results  Component Value Date   TSH 0.86 10/21/2023    03/23/24 MRI brain - normal  03/23/24 EEG  - normal   ASSESSMENT AND PLAN  33 y.o. year old male here with:   Dx:  1. Seizure disorder (HCC)   2. Alcohol use disorder   3. Seizure-like activity (HCC)       PLAN:  SEIZURE DISORDER (03/22/24 x 2; 05/31/24 x 1; last seizure 05/31/24)  - based on review of hospital records, concern for alcohol use disorder and alcohol withdrawal seizures; however most recent seizure in June was not clearly associated with alcohol withdrawal but unclear whether this is accurate or not.  Therefore patient empirically on levetiracetam  500mg  twice a day.  - continue levetiracetam  500mg  twice a day   - caution with alcohol use disorder; consider PCP or psychiatry follow up  - According to [place] law, you can not drive unless you are seizure / syncope free for at least 6 months and under  physician's care.   - Please maintain precautions. Do not participate in activities where a loss of awareness could harm you or someone else. No swimming alone, no tub bathing, no hot tubs, no driving, no operating motorized vehicles (cars, ATVs, motocycles, etc), lawnmowers, power tools or firearms. No standing at heights, such as rooftops, ladders or stairs. Avoid hot objects such as stoves, heaters, open fires. Wear a helmet when riding a bicycle, scooter, skateboard, etc. and avoid areas of traffic. Set your water heater to 120 degrees or less.   Meds ordered this encounter  Medications   levETIRAcetam  (KEPPRA ) 500 MG tablet    Sig: Take 1 tablet (500 mg total) by mouth 2 (two) times daily.    Dispense:  180 tablet    Refill:  4   Return in about 6 months (around 01/29/2025) for MyChart visit (15 min).    EDUARD FABIENE HANLON, MD 07/29/2024, 4:54 PM Certified in Neurology, Neurophysiology and Neuroimaging  [facility] [address], [address] [place], [place] [address] [phone]

## 2024-07-29 NOTE — Telephone Encounter (Signed)
 Copied from CRM #[id number]. Topic: Clinical - Medication Refill >> Jul 29, 2024 10:23 AM Jasmin G wrote: Medication:  levETIRAcetam  (KEPPRA ) 500 MG tablet (Expired)  Has the patient contacted their pharmacy? Yes. Pt is aware that he needs authorization from Dr, he is in need of this medication for his well being. (Agent: If no, request that the patient contact the pharmacy for the refill. If patient does not wish to contact the pharmacy document the reason why and proceed with request.) (Agent: If yes, when and what did the pharmacy advise?)  This is the patient's preferred pharmacy:   [id number] - [place], [place] - [address] AT [facility] OF [address] [place]  [address] Phone: [phone] Fax: [phone]  Is this the correct pharmacy for this prescription? Yes If no, delete pharmacy and type the correct one.   Has the prescription been filled recently? No  Is the patient out of the medication? No  Has the patient been seen for an appointment in the last year OR does the patient have an upcoming appointment? Yes  Can we respond through MyChart? Yes  Agent: Please be advised that Rx refills may take up to 3 business days. We ask that you follow-up with your pharmacy.

## 2024-08-11 ENCOUNTER — Other Ambulatory Visit: Payer: Self-pay

## 2024-08-11 DIAGNOSIS — E782 Mixed hyperlipidemia: Secondary | ICD-10-CM

## 2024-08-11 MED ORDER — ATORVASTATIN CALCIUM 20 MG PO TABS: 20.0000 mg | ORAL_TABLET | Freq: Every day | ORAL | 0 refills | Status: DC

## 2024-08-11 NOTE — Telephone Encounter (Signed)
 Pt notified of labs and UC results and verbalized understanding.

## 2024-08-17 ENCOUNTER — Ambulatory Visit: Admitting: Adult Health

## 2024-08-17 ENCOUNTER — Encounter: Payer: Self-pay | Admitting: Adult Health

## 2024-08-17 VITALS — BP 110/70 | HR 86 | Temp 98.4°F | Ht 74.0 in | Wt 178.0 lb

## 2024-08-17 DIAGNOSIS — R748 Abnormal levels of other serum enzymes: Secondary | ICD-10-CM | POA: Diagnosis not present

## 2024-08-17 DIAGNOSIS — E782 Mixed hyperlipidemia: Secondary | ICD-10-CM | POA: Diagnosis not present

## 2024-08-17 DIAGNOSIS — K760 Fatty (change of) liver, not elsewhere classified: Secondary | ICD-10-CM

## 2024-08-17 NOTE — Progress Notes (Signed)
 Subjective:    Patient ID: Tim Foley, male    DOB: Jul 31, 1991, 32 y.o.   MRN: [id number]  HPI 33 year old male who  has a past medical history of Hypertension and Seizures (HCC).  He presents to the office today discuss an UR or RUQ that was done in July 2025 which showed  IMPRESSION: Diffuse increased echogenicity of the hepatic parenchyma is a nonspecific indicator of hepatocellular dysfunction, most commonly steatosis.  He does have a history of elevated liver enzymes and hyperlipidemia. There was some confusion on whether he should start Atorvastatin  so he has not started it yet    Review of Systems See HPI   Past Medical History:  Diagnosis Date   Hypertension    Seizures (HCC)     Social History   Socioeconomic History   Marital status: Single    Spouse name: Not on file   Number of children: Not on file   Years of education: Not on file   Highest education level: Not on file  Occupational History   Not on file  Tobacco Use   Smoking status: Never   Smokeless tobacco: Never  Vaping Use   Vaping status: Never Used  Substance and Sexual Activity   Alcohol use: Yes    Alcohol/week: 4.0 standard drinks of alcohol    Types: 1 Glasses of wine, 1 Cans of beer, 2 Shots of liquor per week    Comment: occasionlly   Drug use: No   Sexual activity: Yes    Partners: Female    Birth control/protection: None  Other Topics Concern   Not on file  Social History Narrative   He works at [employer] and gamble    Social Drivers of Corporate investment banker Strain: Not on file  Food Insecurity: No Food Insecurity (03/22/2024)   Hunger Vital Sign    Worried About Running Out of Food in the Last Year: Never true    Ran Out of Food in the Last Year: Never true  Transportation Needs: No Transportation Needs (03/22/2024)   PRAPARE - Administrator, Civil Service (Medical): No    Lack of Transportation (Non-Medical): No  Physical Activity: Not on file   Stress: Not on file  Social Connections: Not on file  Intimate Partner Violence: Not At Risk (03/22/2024)   Humiliation, Afraid, Rape, and Kick questionnaire    Fear of Current or Ex-Partner: No    Emotionally Abused: No    Physically Abused: No    Sexually Abused: No    No past surgical history on file.  Family History  Problem Relation Age of Onset   High blood pressure Mother    Heart attack Father    Colon cancer Maternal Grandmother    High blood pressure Maternal Grandmother    Diabetes Maternal Grandmother    Cancer - Colon Maternal Grandmother     No Known Allergies  Current Outpatient Medications on File Prior to Visit  Medication Sig Dispense Refill   amLODipine  (NORVASC ) 5 MG tablet Take 1 tablet (5 mg total) by mouth daily. 90 tablet 3   levETIRAcetam  (KEPPRA ) 500 MG tablet Take 1 tablet (500 mg total) by mouth 2 (two) times daily. 180 tablet 4   atorvastatin  (LIPITOR) 20 MG tablet Take 1 tablet (20 mg total) by mouth daily. (Patient not taking: Reported on 08/17/2024) 90 tablet 0   No current facility-administered medications on file prior to visit.    Pulse 86  Temp 98.4 F (36.9 C) (Oral)   Ht 6' 2 (1.88 m)   Wt 178 lb (80.7 kg)   SpO2 99%   BMI 22.85 kg/m       Objective:   Physical Exam Vitals and nursing note reviewed.  Constitutional:      Appearance: Normal appearance.  Cardiovascular:     Rate and Rhythm: Normal rate and regular rhythm.     Pulses: Normal pulses.     Heart sounds: Normal heart sounds.  Pulmonary:     Effort: Pulmonary effort is normal.     Breath sounds: Normal breath sounds.  Musculoskeletal:        General: Normal range of motion.  Skin:    General: Skin is warm and dry.  Neurological:     General: No focal deficit present.     Mental Status: He is alert and oriented to person, place, and time.  Psychiatric:        Mood and Affect: Mood normal.        Behavior: Behavior normal.        Thought Content: Thought  content normal.        Judgment: Judgment normal.        Assessment & Plan:  1. Elevated liver enzymes (Primary) - I think he elevated liver enzymes are coming from steatosis caused by high cholesterol. I advised to start statin and recheck in 3 months. He would also like to be referred to GI  - Ambulatory referral to Gastroenterology  2. Mixed hyperlipidemia - Start atorvastatin  20 mg   3. Steatosis of liver  - Ambulatory referral to Gastroenterology  Darleene Shape, NP

## 2024-09-13 ENCOUNTER — Telehealth: Payer: Self-pay | Admitting: Diagnostic Neuroimaging

## 2024-09-13 NOTE — Telephone Encounter (Signed)
 Pt called to request  if he can get letter for clearance for 40 hours for work.Pt PCP recommended Pt ask Neurologist ,

## 2024-09-16 NOTE — Telephone Encounter (Signed)
 Are you willing to provide letter allowing pt to work 40 hours a week?

## 2024-10-19 ENCOUNTER — Encounter: Payer: Self-pay | Admitting: Adult Health

## 2024-10-19 ENCOUNTER — Ambulatory Visit: Admitting: Adult Health

## 2024-10-19 ENCOUNTER — Other Ambulatory Visit: Payer: Self-pay | Admitting: Adult Health

## 2024-10-19 ENCOUNTER — Ambulatory Visit: Payer: Self-pay | Admitting: Adult Health

## 2024-10-19 VITALS — BP 132/78 | HR 81 | Temp 98.1°F | Ht 74.0 in | Wt 172.0 lb

## 2024-10-19 DIAGNOSIS — I10 Essential (primary) hypertension: Secondary | ICD-10-CM

## 2024-10-19 DIAGNOSIS — R5383 Other fatigue: Secondary | ICD-10-CM | POA: Diagnosis not present

## 2024-10-19 DIAGNOSIS — E782 Mixed hyperlipidemia: Secondary | ICD-10-CM

## 2024-10-19 DIAGNOSIS — G40919 Epilepsy, unspecified, intractable, without status epilepticus: Secondary | ICD-10-CM | POA: Diagnosis not present

## 2024-10-19 DIAGNOSIS — R634 Abnormal weight loss: Secondary | ICD-10-CM

## 2024-10-19 LAB — COMPREHENSIVE METABOLIC PANEL WITH GFR
ALT: 178 U/L — ABNORMAL HIGH (ref 0–53)
AST: 528 U/L — ABNORMAL HIGH (ref 0–37)
Albumin: 4.9 g/dL (ref 3.5–5.2)
Alkaline Phosphatase: 86 U/L (ref 39–117)
BUN: 7 mg/dL (ref 6–23)
CO2: 28 meq/L (ref 19–32)
Calcium: 9.8 mg/dL (ref 8.4–10.5)
Chloride: 94 meq/L — ABNORMAL LOW (ref 96–112)
Creatinine, Ser: 0.79 mg/dL (ref 0.40–1.50)
GFR: 117.51 mL/min (ref >60.00)
Glucose, Bld: 84 mg/dL (ref 70–99)
Potassium: 4 meq/L (ref 3.5–5.1)
Sodium: 135 meq/L (ref 135–145)
Total Bilirubin: 2.7 mg/dL — ABNORMAL HIGH (ref 0.2–1.2)
Total Protein: 7.6 g/dL (ref 6.0–8.3)

## 2024-10-19 LAB — LIPID PANEL
Cholesterol: 271 mg/dL — ABNORMAL HIGH (ref 0–200)
HDL: 75.1 mg/dL (ref >39.00)
LDL Cholesterol: 176 mg/dL — ABNORMAL HIGH (ref 0–99)
NonHDL: 195.72
Total CHOL/HDL Ratio: 4
Triglycerides: 98 mg/dL (ref 0.0–149.0)
VLDL: 19.6 mg/dL (ref 0.0–40.0)

## 2024-10-19 NOTE — Progress Notes (Signed)
 Subjective:    Patient ID: Tim Foley, male    DOB: 05-08-1991, 33 y.o.   MRN: [id number]  HPI  Discussed the use of AI scribe software for clinical note transcription with the patient, who gave verbal consent to proceed.  History of Present Illness   Tim Foley is a 33 year old male with a history of seizures who presents to the office today for follow up.   He has lost weight from 180 pounds two months ago to 166 pounds last week, with a current weight of 172 pounds. He attributes the weight loss to decreased appetite and has been trying to eat more, including fruits and fresh produce, to regain weight. Over the past week, he has been forcing himself to eat more and has gained a few pounds.  He has been on Keppra  for a couple of months and questions whether it might be contributing to his decreased appetite. He is currently taking Keppra  twice a day. He also takes blood pressure medication and Lipitor (atorvastatin ) since August.  He is experiencing stress related to his employment situation, including being at home alone and not being able to work. He feels tired and sometimes only wants to sleep, with difficulty sleeping at night. He has not been exercising as he used to, which he feels has contributed to his fatigue and weight loss. He reports stress related to his situation but does not specifically endorse anxiety or depression.       Review of Systems See HPI   Past Medical History:  Diagnosis Date   Hypertension    Seizures (HCC)     Social History   Socioeconomic History   Marital status: Single    Spouse name: Not on file   Number of children: Not on file   Years of education: Not on file   Highest education level: Not on file  Occupational History   Not on file  Tobacco Use   Smoking status: Never   Smokeless tobacco: Never  Vaping Use   Vaping status: Never Used  Substance and Sexual Activity   Alcohol use: Yes    Alcohol/week: 4.0 standard drinks  of alcohol    Types: 1 Glasses of wine, 1 Cans of beer, 2 Shots of liquor per week    Comment: occasionlly   Drug use: No   Sexual activity: Yes    Partners: Female    Birth control/protection: None  Other Topics Concern   Not on file  Social History Narrative   He works at [employer] and gamble    Social Drivers of Corporate investment banker Strain: Not on file  Food Insecurity: No Food Insecurity (03/22/2024)   Hunger Vital Sign    Worried About Running Out of Food in the Last Year: Never true    Ran Out of Food in the Last Year: Never true  Transportation Needs: No Transportation Needs (03/22/2024)   PRAPARE - Administrator, Civil Service (Medical): No    Lack of Transportation (Non-Medical): No  Physical Activity: Not on file  Stress: Not on file  Social Connections: Not on file  Intimate Partner Violence: Not At Risk (03/22/2024)   Humiliation, Afraid, Rape, and Kick questionnaire    Fear of Current or Ex-Partner: No    Emotionally Abused: No    Physically Abused: No    Sexually Abused: No    History reviewed. No pertinent surgical history.  Family History  Problem Relation Age of  Onset   High blood pressure Mother    Heart attack Father    Colon cancer Maternal Grandmother    High blood pressure Maternal Grandmother    Diabetes Maternal Grandmother    Cancer - Colon Maternal Grandmother     No Known Allergies  Current Outpatient Medications on File Prior to Visit  Medication Sig Dispense Refill   amLODipine  (NORVASC ) 5 MG tablet Take 1 tablet (5 mg total) by mouth daily. 90 tablet 3   atorvastatin  (LIPITOR) 20 MG tablet Take 1 tablet (20 mg total) by mouth daily. 90 tablet 0   levETIRAcetam  (KEPPRA ) 500 MG tablet Take 1 tablet (500 mg total) by mouth 2 (two) times daily. 180 tablet 4   No current facility-administered medications on file prior to visit.    BP 132/78   Pulse 81   Temp 98.1 F (36.7 C) (Oral)   Ht 6' 2 (1.88 m)   Wt 172 lb (78  kg)   SpO2 95%   BMI 22.08 kg/m       Objective:   Physical Exam Vitals and nursing note reviewed.  Constitutional:      Appearance: Normal appearance.  Cardiovascular:     Rate and Rhythm: Normal rate and regular rhythm.     Pulses: Normal pulses.     Heart sounds: Normal heart sounds.  Pulmonary:     Effort: Pulmonary effort is normal.     Breath sounds: Normal breath sounds.  Musculoskeletal:        General: Normal range of motion.  Skin:    General: Skin is warm and dry.     Capillary Refill: Capillary refill takes less than 2 seconds.  Neurological:     General: No focal deficit present.     Mental Status: He is alert and oriented to person, place, and time.  Psychiatric:        Mood and Affect: Mood is anxious.        Behavior: Behavior normal.        Thought Content: Thought content normal.        Judgment: Judgment normal.        Assessment & Plan:  Assessment and Plan    Unintentional weight loss and decreased appetite Weight loss from 178 lbs to 166 lbs, now 172 lbs. Decreased appetite possibly due to Keppra , decreased activity, or stress. - Increase protein intake with lean meats and protein shakes. - Encourage consumption of whole fruits and higher calorie foods.  Fatigue -likely due to decreased activity and stress. - Encourage regular exercise and increased physical activity. - Advise on maintaining a consistent sleep schedule. - Avoid afternoon naps.  Epilepsy Epilepsy well-controlled with Keppra . Seizure-free for several months. Neurologist advised avoiding driving and heavy machinery until six months seizure-free. - Continue Keppra  as prescribed. - Advise to avoid driving and operating heavy machinery until six months seizure-free. - Follow up with neurology in January 2026.  Hypertension Blood pressure well-controlled at 132/78 mmHg on antihypertensive medication. - Continue current antihypertensive medication. - Advise to maintain low  sodium diet.  Hyperlipidemia Started on atorvastatin  in August 2025. Plan to recheck cholesterol panel to assess response. - Order cholesterol panel. - Continue atorvastatin  as prescribed.      I personally spent a total of 33 minutes in the care of the patient today including preparing to see the patient, getting/reviewing separately obtained history, performing a medically appropriate exam/evaluation, counseling and educating, placing orders, and documenting clinical information in the EHR.

## 2024-11-11 ENCOUNTER — Other Ambulatory Visit: Payer: Self-pay | Admitting: Adult Health

## 2024-11-11 DIAGNOSIS — E782 Mixed hyperlipidemia: Secondary | ICD-10-CM

## 2024-11-11 NOTE — Telephone Encounter (Signed)
  The original prescription was discontinued on 10/19/2024 by Merna Huxley, NP. Renewing this prescription may not be appropriate.

## 2024-11-15 ENCOUNTER — Encounter: Payer: Self-pay | Admitting: Internal Medicine

## 2024-11-22 ENCOUNTER — Encounter (HOSPITAL_BASED_OUTPATIENT_CLINIC_OR_DEPARTMENT_OTHER): Payer: Self-pay

## 2024-11-23 ENCOUNTER — Ambulatory Visit (INDEPENDENT_AMBULATORY_CARE_PROVIDER_SITE_OTHER): Admitting: Internal Medicine

## 2024-11-23 ENCOUNTER — Encounter (HOSPITAL_BASED_OUTPATIENT_CLINIC_OR_DEPARTMENT_OTHER): Payer: Self-pay | Admitting: Internal Medicine

## 2024-11-23 VITALS — BP 132/82 | HR 83 | Ht 74.0 in | Wt 175.1 lb

## 2024-11-23 DIAGNOSIS — F1090 Alcohol use, unspecified, uncomplicated: Secondary | ICD-10-CM

## 2024-11-23 DIAGNOSIS — F10930 Alcohol use, unspecified with withdrawal, uncomplicated: Secondary | ICD-10-CM

## 2024-11-23 DIAGNOSIS — E7800 Pure hypercholesterolemia, unspecified: Secondary | ICD-10-CM | POA: Diagnosis not present

## 2024-11-23 DIAGNOSIS — R569 Unspecified convulsions: Secondary | ICD-10-CM

## 2024-11-23 DIAGNOSIS — R748 Abnormal levels of other serum enzymes: Secondary | ICD-10-CM

## 2024-11-23 NOTE — Progress Notes (Signed)
 LIPID CLINIC CONSULT NOTE  Chief Complaint:  Dyslipidemia and elevated liver enzymes  Primary Care Physician: Merna Huxley, NP  Primary Cardiologist:  Tim JAYSON Maxcy, MD  HPI:  Tim Foley is a 33 y.o. male who is being seen today for the evaluation of dyslipidemia and elevated liver enzymes at the request of Merna Huxley, NP.  This is a pleasant 33 year old male kindly referred for evaluation management of dyslipidemia and elevated liver enzymes.  There is a concern for possible familial hyperlipidemia.  Recent labs showed a high total cholesterol 350 with triglycerides 141, HDL 92 and LDL 229.  Subsequently he was placed on statin therapy with atorvastatin  and had a reduction in his cholesterol with total 271, triglycerides 98, HDL 75 and LDL 176.  Unfortunately had significant worsening of liver enzymes.  He does have baseline liver enzyme elevation with AST and ALT in the 100s.  Lab work in October, however showed further increase in AST and ALT to 528 and 178 respectively.  The elevated ratio of AST to ALT (greater than 2: 1) is strongly suggestive of alcohol use.  He does have a history of alcohol use and seizure disorder with withdrawal seizure in the past.  Currently is unemployed and I think is also struggling from some depression.  He says he does feel some hopelessness, he is tearful and does not particularly know how to get back to the work that he used to do since he cannot drive and really cannot work because of his concern of recurrent seizure.  I also suspect he has a genetic cholesterol disorder.  The fact are multiple family members with high cholesterol and heart disease including his father and a grandparent.  PMHx:  Past Medical History:  Diagnosis Date   Hypertension    Seizures (HCC)     History reviewed. No pertinent surgical history.  FAMHx:  Family History  Problem Relation Age of Onset   High blood pressure Mother    Heart attack Father    Colon  cancer Maternal Grandmother    High blood pressure Maternal Grandmother    Diabetes Maternal Grandmother    Cancer - Colon Maternal Grandmother     SOCHx:   reports that he has never smoked. He has never used smokeless tobacco. He reports current alcohol use of about 4.0 standard drinks of alcohol per week. He reports that he does not use drugs.  ALLERGIES:  Allergies  Allergen Reactions   Lipitor [Atorvastatin ]     Elevated liver enzymes     ROS: Pertinent items noted in HPI and remainder of comprehensive ROS otherwise negative.  HOME MEDS: Current Outpatient Medications on File Prior to Visit  Medication Sig Dispense Refill   amLODipine  (NORVASC ) 5 MG tablet Take 1 tablet (5 mg total) by mouth daily. 90 tablet 3   levETIRAcetam  (KEPPRA ) 500 MG tablet Take 1 tablet (500 mg total) by mouth 2 (two) times daily. 180 tablet 4   No current facility-administered medications on file prior to visit.    LABS/IMAGING: No results found for this or any previous visit (from the past 48 hours). No results found.  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   Tim Foley has a possible familial hyperlipidemia with high LDL cholesterol.  Recently has had in  fact weight loss and decreased dietary intake which I think might be related to depression.  He has also had significant alcohol use.  This likely is driving his liver enzymes to be higher and was worsened by the addition of statin therapy.  Ultrasound showed fatty liver however this again may be combination of both alcohol and cholesterol related.  He really needs to reduce his alcohol intake significantly but I advise of course not to try to stop abruptly due to history of seizures.  If he can wean down substantially or off of alcohol then I think it would improve his liver enzymes to the point where we could consider additional therapies.  This could include PCSK9 inhibitor or perhaps a less hepatically metabolized statin such as Livalo.  He might benefit from referral for substance abuse and/or counseling for depression and possible evaluation for medications.  I will plan repeat lipids including a lipid profile, liver enzymes and LP(a) in about 3 months and follow-up at that time with him.  Thanks again for the kind referral.  Tim Tim Maxcy, MD, [facility], FNLA, FACP  [place]  [facility]  Medical Director of the Advanced Lipid Disorders &  Cardiovascular Risk Reduction Clinic Diplomate of the American Board of Clinical Lipidology Attending Cardiologist  Direct Dial: [phone]  Fax: [phone]  Website:  [email or web address] Tim Foley 11/23/2024, 11:59 AM

## 2024-11-23 NOTE — Patient Instructions (Signed)
 Medication Instructions:  No changes *If you need a refill on your cardiac medications before your next appointment, please call your pharmacy*  Lab Work: Your physician recommends that you return for lab work in: 3 months   Lipid panel, Lp(a), and Liver function panel  If you have labs (blood work) drawn today and your tests are completely normal, you will receive your results only by: MyChart Message (if you have MyChart) OR A paper copy in the mail If you have any lab test that is abnormal or we need to change your treatment, we will call you to review the results.  Testing/Procedures: none  Follow-Up: At [facility], you and your health needs are our priority.  As part of our continuing mission to provide you with exceptional heart care, our providers are all part of one team.  This team includes your primary Cardiologist (physician) and Advanced Practice Providers or APPs (Physician Assistants and Nurse Practitioners) who all work together to provide you with the care you need, when you need it.  Your next appointment:   3 month(s)  Provider:   Dr. Mona at [facility]

## 2024-12-28 ENCOUNTER — Telehealth: Payer: Self-pay | Admitting: Adult Health

## 2024-12-28 NOTE — Telephone Encounter (Signed)
 Copied from CRM [id number]. Topic: Medical Record Request - Other >> Dec 28, 2024  1:40 PM Tim Foley wrote: Reason for CRM: Patient is calling stating that he needs return to work paperwork completed states when he can return to work and how many hours he can work, patient isn't sure if Darleene completes that paperwork or the Neurologist. Patient states he has the specific paperwork that needs to be completed.  Tim Foley [phone]

## 2024-12-28 NOTE — Telephone Encounter (Signed)
 Please advise

## 2024-12-29 NOTE — Telephone Encounter (Signed)
 Noted

## 2024-12-29 NOTE — Telephone Encounter (Signed)
 Pt called back and I was able to relay the message. Also advised of the fee that could be charged for filling it out. He will drop off paperwork to office.

## 2024-12-29 NOTE — Telephone Encounter (Signed)
 Left detailed message informing  of update.

## 2025-01-24 ENCOUNTER — Telehealth: Admitting: Diagnostic Neuroimaging

## 2025-01-25 ENCOUNTER — Telehealth: Admitting: Diagnostic Neuroimaging

## 2025-01-25 ENCOUNTER — Encounter: Payer: Self-pay | Admitting: Diagnostic Neuroimaging

## 2025-01-25 DIAGNOSIS — G40909 Epilepsy, unspecified, not intractable, without status epilepticus: Secondary | ICD-10-CM | POA: Diagnosis not present

## 2025-01-25 DIAGNOSIS — R569 Unspecified convulsions: Secondary | ICD-10-CM

## 2025-01-25 MED ORDER — LEVETIRACETAM 500 MG PO TABS: 500.0000 mg | ORAL_TABLET | Freq: Two times a day (BID) | ORAL | 4 refills | Status: AC

## 2025-01-25 NOTE — Telephone Encounter (Signed)
 Pt  called to reschedule appt . Pt stated  he read message late appt rescheduled today @x  11:30  VV

## 2025-01-25 NOTE — Progress Notes (Signed)
 "  [facility]  PATIENT: Tim Foley DOB: [birth year]  REFERRING CLINICIAN: Merna Huxley, NP HISTORY FROM: patient REASON FOR VISIT: follow up   HISTORICAL  CHIEF COMPLAINT:  Chief Complaint  Patient presents with   Seizures    HISTORY OF PRESENT ILLNESS:   UPDATE (01/25/25, VRP): Since last visit, doing well, no more seizures. Doing well. Tolerating meds. 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   PRIOR HPI (07/29/24, VRP): 34 year old male here for evaluation of seizures.  03/22/2024 patient was admitted to the hospital after witnessed seizure at work.  He had a second seizure after arriving in the emergency room.  He was admitted for seizure evaluation and possible alcohol withdrawal.  He was stabilized and discharged on alcohol withdrawal precautions and advised to stop drinking alcohol.  05/31/2024 patient had another seizure at work.  However patient states that he had significant cut down drinking alcohol.  No alcohol withdrawal symptoms upon arrival to emergency room.  Therefore patient was started on levetiracetam  500mg  twice a day and recommended to follow-up with neurology.  Since that time patient tolerating medications.  He still drinks alcohol but has cut down.  He is not that interested in discussing this topic today in this visit.    REVIEW OF SYSTEMS: Full 14 system review of systems performed and negative with exception of: as per HPI.  ALLERGIES: Allergies  Allergen Reactions   Lipitor [Atorvastatin ]     Elevated liver enzymes     HOME MEDICATIONS: Outpatient Medications Prior to Visit  Medication Sig Dispense Refill   amLODipine  (NORVASC ) 5 MG tablet Take 1 tablet (5 mg total) by mouth daily. 90 tablet 3   levETIRAcetam  (KEPPRA ) 500 MG tablet Take 1 tablet (500 mg total) by mouth 2 (two) times daily. 180 tablet 4   No facility-administered medications prior to visit.    PAST MEDICAL HISTORY: Past Medical History:  Diagnosis  Date   Hypertension    Seizures (HCC)     PAST SURGICAL HISTORY: No past surgical history on file.  FAMILY HISTORY: Family History  Problem Relation Age of Onset   High blood pressure Mother    Heart attack Father    Colon cancer Maternal Grandmother    High blood pressure Maternal Grandmother    Diabetes Maternal Grandmother    Cancer - Colon Maternal Grandmother     SOCIAL HISTORY: Social History   Socioeconomic History   Marital status: Single    Spouse name: Not on file   Number of children: Not on file   Years of education: Not on file   Highest education level: Not on file  Occupational History   Not on file  Tobacco Use   Smoking status: Never   Smokeless tobacco: Never  Vaping Use   Vaping status: Never Used  Substance and Sexual Activity   Alcohol use: Yes    Alcohol/week: 4.0 standard drinks of alcohol    Types: 1 Glasses of wine, 1 Cans of beer, 2 Shots of liquor per week    Comment: occasionlly   Drug use: No   Sexual activity: Yes    Partners: Female    Birth control/protection: None  Other Topics Concern   Not on file  Social History Narrative   He works at [employer] and gamble    Social Drivers of Health   Tobacco Use: Low Risk (11/23/2024)   Patient History    Smoking Tobacco Use: Never    Smokeless Tobacco  Use: Never    Passive Exposure: Not on file  Financial Resource Strain: Not on file  Food Insecurity: No Food Insecurity (03/22/2024)   Hunger Vital Sign    Worried About Running Out of Food in the Last Year: Never true    Ran Out of Food in the Last Year: Never true  Transportation Needs: No Transportation Needs (03/22/2024)   PRAPARE - Administrator, Civil Service (Medical): No    Lack of Transportation (Non-Medical): No  Physical Activity: Not on file  Stress: Not on file  Social Connections: Not on file  Intimate Partner Violence: Not At Risk (03/22/2024)   Humiliation, Afraid, Rape, and Kick questionnaire    Fear of  Current or Ex-Partner: No    Emotionally Abused: No    Physically Abused: No    Sexually Abused: No  Depression (PHQ2-9): Low Risk (03/30/2024)   Depression (PHQ2-9)    PHQ-2 Score: 0  Alcohol Screen: Not on file  Housing: High Risk (03/22/2024)   Housing Stability Vital Sign    Unable to Pay for Housing in the Last Year: Yes    Number of Times Moved in the Last Year: 1    Homeless in the Last Year: Yes  Utilities: Not At Risk (03/22/2024)   AHC Utilities    Threatened with loss of utilities: No  Health Literacy: Not on file     PHYSICAL EXAM  GENERAL EXAM/CONSTITUTIONAL: Vitals:  There were no vitals filed for this visit.  There is no height or weight on file to calculate BMI. Wt Readings from Last 3 Encounters:  11/23/24 175 lb 1.6 oz (79.4 kg)  10/19/24 172 lb (78 kg)  08/17/24 178 lb (80.7 kg)   Patient is in no distress; well developed, nourished and groomed; neck is supple  CARDIOVASCULAR: Examination of carotid arteries is normal; no carotid bruits Regular rate and rhythm, no murmurs Examination of peripheral vascular system by observation and palpation is normal  EYES: Ophthalmoscopic exam of optic discs and posterior segments is normal; no papilledema or hemorrhages No results found.  MUSCULOSKELETAL: Gait, strength, tone, movements noted in Neurologic exam below  NEUROLOGIC: MENTAL STATUS:      No data to display         awake, alert, oriented to person, place and time recent and remote memory intact normal attention and concentration language fluent, comprehension intact, naming intact fund of knowledge appropriate  CRANIAL NERVE:  2nd - no papilledema on fundoscopic exam 2nd, 3rd, 4th, 6th - pupils equal and reactive to light, visual fields full to confrontation, extraocular muscles intact, no nystagmus 5th - facial sensation symmetric 7th - facial strength symmetric 8th - hearing intact 9th - palate elevates symmetrically, uvula midline 11th  - shoulder shrug symmetric 12th - tongue protrusion midline  MOTOR:  normal bulk and tone, full strength in the BUE, BLE  SENSORY:  normal and symmetric to light touch, temperature, vibration  COORDINATION:  finger-nose-finger, fine finger movements normal  REFLEXES:  deep tendon reflexes present and symmetric  GAIT/STATION:  narrow based gait     DIAGNOSTIC DATA (LABS, IMAGING, TESTING) - I reviewed patient records, labs, notes, testing and imaging myself where available.  Lab Results  Component Value Date   WBC 8.5 05/31/2024   HGB 11.2 (L) 05/31/2024   HCT 35.2 (L) 05/31/2024   MCV 76.7 (L) 05/31/2024   PLT 210 05/31/2024      Component Value Date/Time   NA 135 10/19/2024 0913  K 4.0 10/19/2024 0913   CL 94 (L) 10/19/2024 0913   CO2 28 10/19/2024 0913   GLUCOSE 84 10/19/2024 0913   BUN 7 10/19/2024 0913   CREATININE 0.79 10/19/2024 0913   CALCIUM  9.8 10/19/2024 0913   PROT 7.6 10/19/2024 0913   ALBUMIN 4.9 10/19/2024 0913   AST 528 (H) 10/19/2024 0913   ALT 178 (H) 10/19/2024 0913   ALKPHOS 86 10/19/2024 0913   BILITOT 2.7 (H) 10/19/2024 0913   GFRNONAA >60 05/31/2024 1552   GFRAA >60 07/10/2020 1755   Lab Results  Component Value Date   CHOL 271 (H) 10/19/2024   HDL 75.10 10/19/2024   LDLCALC 176 (H) 10/19/2024   TRIG 98.0 10/19/2024   CHOLHDL 4 10/19/2024   Lab Results  Component Value Date   HGBA1C 4.9 10/21/2023   No results found for: VITAMINB12 Lab Results  Component Value Date   TSH 0.86 10/21/2023    03/23/24 MRI brain - normal  03/23/24 EEG  - normal   ASSESSMENT AND PLAN  34 y.o. year old male here with:  Dx:  1. Seizure disorder (HCC)   2. Seizure-like activity (HCC)     PLAN:  SEIZURE DISORDER (03/22/24 x 2; 05/31/24 x 1; last seizure 05/31/24)  - based on review of hospital records, concern for alcohol use disorder and alcohol withdrawal seizures; however seizure in June 2025 was not clearly associated with alcohol  withdrawal but unclear whether this is accurate or not.  Therefore patient now on levetiracetam  500mg  twice a day.  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    Dispense:  180 tablet    Refill:  4   Return in about 1 year (around 01/25/2026) for MyChart visit (15 min).  Virtual Visit via Video Note  I connected with Tim Foley on 01/25/2025 at 11:30 AM EST by a video enabled telemedicine application and verified that I am speaking with the correct person using two identifiers.   I discussed the limitations of evaluation and management by telemedicine and the availability of in person appointments. The patient expressed understanding and agreed to proceed.  Patient is at home and I am at the office.   I spent 20 minutes of face-to-face and non-face-to-face time with patient.  This included previsit chart review, lab review, study review, order entry, electronic health record documentation, patient education.      Tim FABIENE HANLON, MD 01/25/2025, 11:39 AM Certified in Neurology, Neurophysiology and Neuroimaging  [facility] [address], [address] [place], [place] [address] [phone]  "

## 2025-01-25 NOTE — Patient Instructions (Signed)
 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

## 2025-01-28 ENCOUNTER — Encounter: Payer: Self-pay | Admitting: Diagnostic Neuroimaging

## 2025-01-31 NOTE — Telephone Encounter (Signed)
 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 Form will be completed once we return to the office.

## 2025-02-01 NOTE — Telephone Encounter (Signed)
 Form has been placed in Dr. Chancy office to be signed

## 2025-02-28 ENCOUNTER — Ambulatory Visit: Admitting: Internal Medicine
